# Patient Record
Sex: Female | Born: 1960 | Race: Asian | Hispanic: No | Marital: Married | Smoking: Never smoker
Health system: Southern US, Community
[De-identification: ages and names within clinical notes are randomized; demographics above are authoritative.]

## PROBLEM LIST (undated history)

## (undated) DIAGNOSIS — I10 Essential (primary) hypertension: Secondary | ICD-10-CM

## (undated) DIAGNOSIS — R519 Headache, unspecified: Secondary | ICD-10-CM

## (undated) DIAGNOSIS — R011 Cardiac murmur, unspecified: Secondary | ICD-10-CM

## (undated) DIAGNOSIS — E785 Hyperlipidemia, unspecified: Secondary | ICD-10-CM

## (undated) DIAGNOSIS — R51 Headache: Secondary | ICD-10-CM

## (undated) DIAGNOSIS — K219 Gastro-esophageal reflux disease without esophagitis: Secondary | ICD-10-CM

## (undated) DIAGNOSIS — G43109 Migraine with aura, not intractable, without status migrainosus: Secondary | ICD-10-CM

## (undated) DIAGNOSIS — E119 Type 2 diabetes mellitus without complications: Secondary | ICD-10-CM

## (undated) DIAGNOSIS — G43909 Migraine, unspecified, not intractable, without status migrainosus: Secondary | ICD-10-CM

## (undated) HISTORY — DX: Migraine with aura, not intractable, without status migrainosus: G43.109

## (undated) HISTORY — DX: Essential (primary) hypertension: I10

## (undated) HISTORY — DX: Headache, unspecified: R51.9

## (undated) HISTORY — PX: BREAST BIOPSY: SHX20

## (undated) HISTORY — DX: Gastro-esophageal reflux disease without esophagitis: K21.9

## (undated) HISTORY — DX: Migraine, unspecified, not intractable, without status migrainosus: G43.909

## (undated) HISTORY — DX: Headache: R51

## (undated) HISTORY — DX: Type 2 diabetes mellitus without complications: E11.9

## (undated) HISTORY — DX: Cardiac murmur, unspecified: R01.1

## (undated) HISTORY — DX: Hyperlipidemia, unspecified: E78.5

---

## 2008-05-28 DIAGNOSIS — Z5189 Encounter for other specified aftercare: Secondary | ICD-10-CM

## 2008-05-28 HISTORY — PX: ABDOMINAL HYSTERECTOMY: SHX81

## 2008-05-28 HISTORY — DX: Encounter for other specified aftercare: Z51.89

## 2016-02-21 ENCOUNTER — Encounter (HOSPITAL_COMMUNITY): Payer: Self-pay | Admitting: Family Medicine

## 2016-02-21 ENCOUNTER — Ambulatory Visit (HOSPITAL_COMMUNITY)
Admission: EM | Admit: 2016-02-21 | Discharge: 2016-02-21 | Disposition: A | Discharge status: Home / Self Care | Payer: Self-pay | Attending: Family Medicine | Admitting: Family Medicine

## 2016-02-21 DIAGNOSIS — M546 Pain in thoracic spine: Secondary | ICD-10-CM

## 2016-02-21 DIAGNOSIS — T148 Other injury of unspecified body region: Secondary | ICD-10-CM

## 2016-02-21 DIAGNOSIS — T148XXA Other injury of unspecified body region, initial encounter: Secondary | ICD-10-CM

## 2016-02-21 DIAGNOSIS — R0789 Other chest pain: Secondary | ICD-10-CM

## 2016-02-21 MED ORDER — DICLOFENAC SODIUM 1 % TD GEL: 1.0000 "application " | Freq: Four times a day (QID) | TRANSDERMAL | 0 refills | Status: DC

## 2016-02-21 NOTE — Discharge Instructions (Signed)
Applying the diclofenac gel 4 times a day to the area pain in her back. Apply heating pad off and on during the day. He also use a firm a care heating wrap to place on the area for heat, they can last up to 8 hours at a time. Perform stretches as demonstrated.
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
## 2016-02-21 NOTE — ED Triage Notes (Signed)
Pt here for mid upper back pain that is radiating into epigastric area since yesterday. sts that it is hard for her to take a deep breath. sts some dizziness at times. Pt hypertensive and denies taking BP meds.

## 2016-03-05 ENCOUNTER — Ambulatory Visit (INDEPENDENT_AMBULATORY_CARE_PROVIDER_SITE_OTHER): Payer: Managed Care, Other (non HMO) | Admitting: Primary Care

## 2016-03-05 ENCOUNTER — Encounter: Payer: Self-pay | Admitting: Primary Care

## 2016-03-05 VITALS — BP 158/110 | HR 73 | Temp 98.1°F | Ht 62.75 in | Wt 174.4 lb

## 2016-03-05 DIAGNOSIS — R51 Headache: Secondary | ICD-10-CM | POA: Diagnosis not present

## 2016-03-05 DIAGNOSIS — I10 Essential (primary) hypertension: Secondary | ICD-10-CM

## 2016-03-05 DIAGNOSIS — E66811 Obesity, class 1: Secondary | ICD-10-CM

## 2016-03-05 DIAGNOSIS — E669 Obesity, unspecified: Secondary | ICD-10-CM | POA: Diagnosis not present

## 2016-03-05 DIAGNOSIS — R519 Headache, unspecified: Secondary | ICD-10-CM

## 2016-03-05 MED ORDER — AMLODIPINE BESYLATE 10 MG PO TABS: 10.0000 mg | ORAL_TABLET | Freq: Every day | ORAL | 1 refills | Status: DC

## 2016-03-05 NOTE — Progress Notes (Signed)
Pre visit review using our clinic review tool, if applicable. No additional management support is needed unless otherwise documented below in the visit note. 

## 2016-03-05 NOTE — Assessment & Plan Note (Signed)
Ongoing for years. 8-10 migraines annually. Hypertension likely contributing and will treat. Continue tylenol/excedrin migraine for now, hopefully we will see a reduction in headaches/migraines once BP under control.
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
## 2016-03-05 NOTE — Assessment & Plan Note (Signed)
Steady weight gain over the past 3 years. Poor diet and does not exercise. Encouraged both and information provided on improvements.

## 2016-03-05 NOTE — Assessment & Plan Note (Signed)
No prior diagnosis. Evidence of numerous elevated readings over the past several weeks. BP above goal in the clinic today. Rx for Amlodipine 10 mg sent to pharmacy. She will check BP and bring readings to next visit in 2-3 weeks. BMP next visit.

## 2016-03-05 NOTE — Patient Instructions (Signed)
Start Amlodipine 10 mg tablets for high blood pressure. Take 1 tablet by mouth once daily.  Check your blood pressure daily, around the same time of day, for the next 2 weeks.  Ensure that you have rested for 30 minutes prior to checking your blood pressure. Record your readings and bring them to your next visit.  Work to decrease consumption of fast food, fried food, salty food. Take a look at the information below.  Follow up in 2-3 weeks for re-evaluation of your blood pressure and for complete physical.  It was a pleasure to meet you today! Please don't hesitate to call me with any questions. Welcome to [facility]!  DASH Eating Plan DASH stands for "Dietary Approaches to Stop Hypertension." The DASH eating plan is a healthy eating plan that has been shown to reduce high blood pressure (hypertension). Additional health benefits may include reducing the risk of type 2 diabetes mellitus, heart disease, and stroke. The DASH eating plan may also help with weight loss. WHAT DO I NEED TO KNOW ABOUT THE DASH EATING PLAN? For the DASH eating plan, you will follow these general guidelines:  Choose foods with a percent daily value for sodium of less than 5% (as listed on the food label).  Use salt-free seasonings or herbs instead of table salt or sea salt.  Check with your health care provider or pharmacist before using salt substitutes.  Eat lower-sodium products, often labeled as "lower sodium" or "no salt added."  Eat fresh foods.  Eat more vegetables, fruits, and low-fat dairy products.  Choose whole grains. Look for the word "whole" as the first word in the ingredient list.  Choose fish and skinless chicken or [place] more often than red meat. Limit fish, poultry, and meat to 6 oz (170 g) each day.  Limit sweets, desserts, sugars, and sugary drinks.  Choose heart-healthy fats.  Limit cheese to 1 oz (28 g) per day.  Eat more home-cooked food and less restaurant, buffet, and fast  food.  Limit fried foods.  Cook foods using methods other than frying.  Limit canned vegetables. If you do use them, rinse them well to decrease the sodium.  When eating at a restaurant, ask that your food be prepared with less salt, or no salt if possible. WHAT FOODS CAN I EAT? Seek help from a dietitian for individual calorie needs. Grains Whole grain or whole wheat bread. Brown rice. Whole grain or whole wheat pasta. Quinoa, bulgur, and whole grain cereals. Low-sodium cereals. Corn or whole wheat flour tortillas. Whole grain cornbread. Whole grain crackers. Low-sodium crackers. Vegetables Fresh or frozen vegetables (raw, steamed, roasted, or grilled). Low-sodium or reduced-sodium tomato and vegetable juices. Low-sodium or reduced-sodium tomato sauce and paste. Low-sodium or reduced-sodium canned vegetables.  Fruits All fresh, canned (in natural juice), or frozen fruits. Meat and Other Protein Products Ground beef (85% or leaner), grass-fed beef, or beef trimmed of fat. Skinless chicken or [place]. Ground chicken or [place]. Pork trimmed of fat. All fish and seafood. Eggs. Dried beans, peas, or lentils. Unsalted nuts and seeds. Unsalted canned beans. Dairy Low-fat dairy products, such as skim or 1% milk, 2% or reduced-fat cheeses, low-fat ricotta or cottage cheese, or plain low-fat yogurt. Low-sodium or reduced-sodium cheeses. Fats and Oils Tub margarines without trans fats. Light or reduced-fat mayonnaise and salad dressings (reduced sodium). Avocado. Safflower, olive, or canola oils. Natural peanut or almond butter. Other Unsalted popcorn and pretzels. The items listed above may not be a complete list of recommended  foods or beverages. Contact your dietitian for more options. WHAT FOODS ARE NOT RECOMMENDED? Grains White bread. White pasta. White rice. Refined cornbread. Bagels and croissants. Crackers that contain trans fat. Vegetables Creamed or fried vegetables. Vegetables in a  cheese sauce. Regular canned vegetables. Regular canned tomato sauce and paste. Regular tomato and vegetable juices. Fruits Dried fruits. Canned fruit in light or heavy syrup. Fruit juice. Meat and Other Protein Products Fatty cuts of meat. Ribs, chicken wings, bacon, sausage, bologna, salami, chitterlings, fatback, hot dogs, bratwurst, and packaged luncheon meats. Salted nuts and seeds. Canned beans with salt. Dairy Whole or 2% milk, cream, half-and-half, and cream cheese. Whole-fat or sweetened yogurt. Full-fat cheeses or blue cheese. Nondairy creamers and whipped toppings. Processed cheese, cheese spreads, or cheese curds. Condiments Onion and garlic salt, seasoned salt, table salt, and sea salt. Canned and packaged gravies. Worcestershire sauce. Tartar sauce. Barbecue sauce. Teriyaki sauce. Soy sauce, including reduced sodium. Steak sauce. Fish sauce. Oyster sauce. Cocktail sauce. Horseradish. Ketchup and mustard. Meat flavorings and tenderizers. Bouillon cubes. Hot sauce. Tabasco sauce. Marinades. Taco seasonings. Relishes. Fats and Oils Butter, stick margarine, lard, shortening, ghee, and bacon fat. Coconut, palm kernel, or palm oils. Regular salad dressings. Other Pickles and olives. Salted popcorn and pretzels. The items listed above may not be a complete list of foods and beverages to avoid. Contact your dietitian for more information. WHERE CAN I FIND MORE INFORMATION? National Heart, Lung, and Blood Institute: travelstabloid.com   This information is not intended to replace advice given to you by your health care provider. Make sure you discuss any questions you have with your health care provider.   Document Released: 05/03/2011 Document Revised: 06/04/2014 Document Reviewed: 03/18/2013 Elsevier Interactive Patient Education [employer].

## 2016-03-15 ENCOUNTER — Other Ambulatory Visit: Payer: Self-pay | Admitting: Primary Care

## 2016-03-15 DIAGNOSIS — I10 Essential (primary) hypertension: Secondary | ICD-10-CM

## 2016-03-15 DIAGNOSIS — Z0000 Encounter for general adult medical examination without abnormal findings: Secondary | ICD-10-CM

## 2016-03-23 ENCOUNTER — Other Ambulatory Visit (INDEPENDENT_AMBULATORY_CARE_PROVIDER_SITE_OTHER): Payer: Managed Care, Other (non HMO)

## 2016-03-23 DIAGNOSIS — I10 Essential (primary) hypertension: Secondary | ICD-10-CM

## 2016-03-23 DIAGNOSIS — Z0000 Encounter for general adult medical examination without abnormal findings: Secondary | ICD-10-CM

## 2016-03-23 LAB — COMPREHENSIVE METABOLIC PANEL
ALBUMIN: 4.5 g/dL (ref 3.5–5.2)
ALK PHOS: 59 U/L (ref 39–117)
ALT: 30 U/L (ref 0–35)
AST: 22 U/L (ref 0–37)
BILIRUBIN TOTAL: 0.4 mg/dL (ref 0.2–1.2)
BUN: 16 mg/dL (ref 6–23)
CALCIUM: 9.9 mg/dL (ref 8.4–10.5)
CHLORIDE: 101 meq/L (ref 96–112)
CO2: 31 mEq/L (ref 19–32)
CREATININE: 0.74 mg/dL (ref 0.40–1.20)
GFR: 85.94 mL/min (ref >60.00)
Glucose, Bld: 92 mg/dL (ref 70–99)
Potassium: 3.9 mEq/L (ref 3.5–5.1)
SODIUM: 139 meq/L (ref 135–145)
TOTAL PROTEIN: 8.5 g/dL — AB (ref 6.0–8.3)

## 2016-03-23 LAB — LIPID PANEL
CHOLESTEROL: 246 mg/dL — AB (ref 0–200)
HDL: 55.5 mg/dL (ref >39.00)
LDL Cholesterol: 156 mg/dL — ABNORMAL HIGH (ref 0–99)
NONHDL: 190.2
Total CHOL/HDL Ratio: 4
Triglycerides: 169 mg/dL — ABNORMAL HIGH (ref 0.0–149.0)
VLDL: 33.8 mg/dL (ref 0.0–40.0)

## 2016-03-23 LAB — HEMOGLOBIN A1C: HEMOGLOBIN A1C: 5.9 % (ref 4.6–6.5)

## 2016-03-26 LAB — VITAMIN D 25 HYDROXY (VIT D DEFICIENCY, FRACTURES): VITD: 23.01 ng/mL — AB (ref 30.00–100.00)

## 2016-03-29 ENCOUNTER — Encounter: Payer: Managed Care, Other (non HMO) | Admitting: Primary Care

## 2016-03-30 ENCOUNTER — Ambulatory Visit (INDEPENDENT_AMBULATORY_CARE_PROVIDER_SITE_OTHER): Payer: Managed Care, Other (non HMO) | Admitting: Primary Care

## 2016-03-30 ENCOUNTER — Encounter: Payer: Self-pay | Admitting: Primary Care

## 2016-03-30 VITALS — BP 160/102 | HR 67 | Temp 98.0°F | Ht 63.0 in | Wt 175.8 lb

## 2016-03-30 DIAGNOSIS — Z23 Encounter for immunization: Secondary | ICD-10-CM | POA: Diagnosis not present

## 2016-03-30 DIAGNOSIS — E785 Hyperlipidemia, unspecified: Secondary | ICD-10-CM | POA: Insufficient documentation

## 2016-03-30 DIAGNOSIS — Z0001 Encounter for general adult medical examination with abnormal findings: Secondary | ICD-10-CM

## 2016-03-30 DIAGNOSIS — I10 Essential (primary) hypertension: Secondary | ICD-10-CM

## 2016-03-30 DIAGNOSIS — Z1211 Encounter for screening for malignant neoplasm of colon: Secondary | ICD-10-CM

## 2016-03-30 DIAGNOSIS — R51 Headache: Secondary | ICD-10-CM

## 2016-03-30 DIAGNOSIS — E782 Mixed hyperlipidemia: Secondary | ICD-10-CM

## 2016-03-30 DIAGNOSIS — R7303 Prediabetes: Secondary | ICD-10-CM

## 2016-03-30 DIAGNOSIS — Z1231 Encounter for screening mammogram for malignant neoplasm of breast: Secondary | ICD-10-CM

## 2016-03-30 DIAGNOSIS — Z1239 Encounter for other screening for malignant neoplasm of breast: Secondary | ICD-10-CM

## 2016-03-30 DIAGNOSIS — E119 Type 2 diabetes mellitus without complications: Secondary | ICD-10-CM | POA: Insufficient documentation

## 2016-03-30 DIAGNOSIS — Z0000 Encounter for general adult medical examination without abnormal findings: Secondary | ICD-10-CM

## 2016-03-30 MED ORDER — LISINOPRIL 20 MG PO TABS: 20.0000 mg | ORAL_TABLET | Freq: Every day | ORAL | 1 refills | Status: DC

## 2016-03-30 NOTE — Progress Notes (Signed)
Subjective:    Patient ID: Isabel Caprice, female    DOB: May 15, 1961, 55 y.o.   MRN: [id number]  HPI  Ms. Kio is a 55 year old female who presents today for complete physical.  Immunizations: -Tetanus: Unsure, believes it's been over 10 years. Due. -Influenza: Declines   Diet: She endorses a fair diet. Breakfast: Herbal life shake Lunch: Sandwich Dinner: Salad, meat, pasta, take out food Snacks: Chips Desserts: Occasionally Beverages: Water, coffee, occasional soda  Exercise: She does not currently exercise Eye exam: Completed 2 years ago.  Dental exam: Has not been in 5 years. Colonoscopy: Never completed, due. Pap Smear: Hysterectomy  Mammogram: Been over 5 years, due.  1) Essential Hypertension: Presented as a new patient on 03/05/16 with numerous elevated readings of high blood pressure without a prior diagnosis. She was treated with Amlodipine 10 mg and told to follow up today. She's not been taking the Amlodipine regularly as she has noticed increased headaches and feels "off" after she takes the Amlodipine. She has been checking her BP when on the Amlodipine sparingly which is ranging 150-160/90's.   Review of Systems  Constitutional: Negative for unexpected weight change.  HENT: Negative for rhinorrhea.   Eyes: Positive for visual disturbance.  Respiratory: Negative for cough and shortness of breath.   Cardiovascular: Negative for chest pain.  Gastrointestinal: Negative for constipation and diarrhea.  Genitourinary: Negative for difficulty urinating.  Musculoskeletal: Negative for arthralgias and myalgias.  Skin: Negative for rash.  Allergic/Immunologic: Positive for environmental allergies.  Neurological: Positive for numbness and headaches.  Psychiatric/Behavioral:       Denies concerns for anxiety or depression       Past Medical History:  Diagnosis Date  . Frequent headaches   . Heart murmur   . Hyperlipidemia   . Hypertension   . Migraine        Social History   Social History  . Marital status: Married    Spouse name: N/A  . Number of children: N/A  . Years of education: N/A   Occupational History  . Not on file.   Social History Main Topics  . Smoking status: Never Smoker  . Smokeless tobacco: Never Used  . Alcohol use Not on file  . Drug use: Unknown  . Sexual activity: Not on file   Other Topics Concern  . Not on file   Social History Narrative   Married.   6 children. 1 grandchild.   Works at [employer].   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    Past Surgical History:  Procedure Laterality Date  . ABDOMINAL HYSTERECTOMY  2010    Family History  Problem Relation Age of Onset  . Hyperlipidemia Mother   . Heart disease Mother   . Hypertension Mother   . Heart disease Maternal Grandmother   . Hyperlipidemia Maternal Grandmother   . Hypertension Maternal Grandmother     Allergies  Allergen Reactions  . Penicillins     No current outpatient prescriptions on file prior to visit.   No current facility-administered medications on file prior to visit.     BP (!) 160/102   Pulse 67   Temp 98 F (36.7 C) (Oral)   Ht 5\' 3"  (1.6 m)   Wt 175 lb 12.8 oz (79.7 kg)   SpO2 98%   BMI 31.14 kg/m    Objective:   Physical Exam  Constitutional: She is oriented to person, place, and time. She appears well-nourished.  HENT:  Right Ear: Tympanic membrane  and ear canal normal.  Left Ear: Tympanic membrane and ear canal normal.  Nose: Nose normal.  Mouth/Throat: Oropharynx is clear and moist.  Eyes: Conjunctivae and EOM are normal. Pupils are equal, round, and reactive to light.  Neck: Neck supple. No thyromegaly present.  Cardiovascular: Normal rate and regular rhythm.   No murmur heard. Pulmonary/Chest: Effort normal and breath sounds normal. She has no rales.  Abdominal: Soft. Bowel sounds are normal. There is no tenderness.  Musculoskeletal: Normal range of motion.  Lymphadenopathy:    She has no  cervical adenopathy.  Neurological: She is alert and oriented to person, place, and time. She has normal reflexes. No cranial nerve deficit.  Skin: Skin is warm and dry. No rash noted.  Psychiatric: She has a normal mood and affect.          Assessment & Plan:

## 2016-03-30 NOTE — Assessment & Plan Note (Signed)
TC, LDL and Trigs above goal. Long discussion today regarding importance of improvement in diet and regular exercise. Repeat lipids in 4 months, if above goal, then will consider treatment.

## 2016-03-30 NOTE — Assessment & Plan Note (Signed)
Not taking Amlodipine regularly due to "feeling off". Stop Amlodipine, start Lisinopril 20 mg. BMP stable. Long discussion today regarding importance of BP control. Will call for readings in 2 weeks. Return precautions provided.

## 2016-03-30 NOTE — Assessment & Plan Note (Signed)
Tetanus due, provided today. Declines influenza. Mammogram and colonoscopy due, ordered today. Discussed the importance of a healthy diet and regular exercise in order for weight loss, and to reduce the risk of other medical diseases. Exam unremarkable. Labs with hyperlipidemia and prediabetes which were addressed. Follow up in 1 year for annual physical.

## 2016-03-30 NOTE — Progress Notes (Signed)
Pre visit review using our clinic review tool, if applicable. No additional management support is needed unless otherwise documented below in the visit note. 

## 2016-03-30 NOTE — Assessment & Plan Note (Signed)
A1C of 5.9. Overall poor diet and does not exercise. Long discussion today regarding importance of healthy diet and regular exercise to reduce levels. Repeat A1C in 4 months.

## 2016-03-30 NOTE — Patient Instructions (Addendum)
Stop taking Amlodipine 10 mg tablets for high blood pressure.   Start Lisinopril 20 mg tablets for high blood pressure. Take 1 tablet by mouth once daily.  Continue to monitor your blood pressure. I will call you in 2 weeks for your blood pressure readings.  Your vitamin D level is too low. Start taking Vitamin D 1000 units once daily. This may be purchased over the counter.  You are prediabetic. This means you're are at risk for developing diabetes.  Your cholesterol is too high.  It is important that you improve your diet. Please limit carbohydrates in the form of white bread, rice, pasta, sweets, fast food, fried food, sugary drinks, etc. Increase your consumption of fresh fruits and vegetables, whole grains, lean protein.  Ensure you are consuming 64 ounces of water daily.  Start exercising. You should be getting 150 minutes of moderate intensity exercise weekly.  Please schedule a lab only appointment in 4 months to recheck your cholesterol and blood sugars. Ensure you come fasting at least 4 hours.  It was a pleasure to see you today!   High Cholesterol High cholesterol refers to having a high level of cholesterol in your blood. Cholesterol is a white, waxy, fat-like protein that your body needs in small amounts. Your liver makes all the cholesterol you need. Excess cholesterol comes from the food you eat. Cholesterol travels in your bloodstream through your blood vessels. If you have high cholesterol, deposits (plaque) may build up on the walls of your blood vessels. This makes the arteries narrower and stiffer. Plaque increases your risk of heart attack and stroke. Work with your health care provider to keep your cholesterol levels in a healthy range. RISK FACTORS Several things can make you more likely to have high cholesterol. These include:   Eating foods high in animal fat (saturated fat) or cholesterol.  Being overweight.  Not getting enough exercise.  Having a family  history of high cholesterol. SIGNS AND SYMPTOMS High cholesterol does not cause symptoms. DIAGNOSIS  Your health care provider can do a blood test to check whether you have high cholesterol. If you are older than 20, your health care provider may check your cholesterol every 4-6 years. You may be checked more often if you already have high cholesterol or other risk factors for heart disease. The blood test for cholesterol measures the following:  Bad cholesterol (LDL cholesterol). This is the type of cholesterol that causes heart disease. This number should be less than 100.  Good cholesterol (HDL cholesterol). This type helps protect against heart disease. A healthy level of HDL cholesterol is 60 or higher.  Total cholesterol. This is the combined number of LDL cholesterol and HDL cholesterol. A healthy number is less than 200. TREATMENT  High cholesterol can be treated with diet changes, lifestyle changes, and medicine.   Diet changes may include eating more whole grains, fruits, vegetables, nuts, and fish. You may also have to cut back on red meat and foods with a lot of added sugar.  Lifestyle changes may include getting at least 40 minutes of aerobic exercise three times a week. Aerobic exercises include walking, biking, and swimming. Aerobic exercise along with a healthy diet can help you maintain a healthy weight. Lifestyle changes may also include quitting smoking.  If diet and lifestyle changes are not enough to lower your cholesterol, your health care provider may prescribe a statin medicine. This medicine has been shown to lower cholesterol and also lower the risk of heart  disease. HOME CARE INSTRUCTIONS  Only take over-the-counter or prescription medicines as directed by your health care provider.   Follow a healthy diet as directed by your health care provider. For instance:   Eat chicken (without skin), fish, veal, shellfish, ground [place] breast, and round or loin cuts of  red meat.  Do not eat fried foods and fatty meats, such as hot dogs and salami.   Eat plenty of fruits, such as apples.   Eat plenty of vegetables, such as broccoli, potatoes, and carrots.   Eat beans, peas, and lentils.   Eat grains, such as barley, rice, couscous, and bulgur wheat.   Eat pasta without cream sauces.   Use skim or nonfat milk and low-fat or nonfat yogurt and cheeses. Do not eat or drink whole milk, cream, ice cream, egg yolks, and hard cheeses.   Do not eat stick margarine or tub margarines that contain trans fats (also called partially hydrogenated oils).   Do not eat cakes, cookies, crackers, or other baked goods that contain trans fats.   Do not eat saturated tropical oils, such as coconut and palm oil.   Exercise as directed by your health care provider. Increase your activity level with activities such as gardening or walking.   Keep all follow-up appointments.  SEEK MEDICAL CARE IF:  You are struggling to maintain a healthy diet or weight.  You need help starting an exercise program.  You need help to stop smoking. SEEK IMMEDIATE MEDICAL CARE IF:  You have chest pain.  You have trouble breathing.   This information is not intended to replace advice given to you by your health care provider. Make sure you discuss any questions you have with your health care provider.   Document Released: 05/14/2005 Document Revised: 06/04/2014 Document Reviewed: 03/06/2013 Elsevier Interactive Patient Education [employer].

## 2016-04-02 ENCOUNTER — Encounter: Payer: Self-pay | Admitting: Internal Medicine

## 2016-04-13 ENCOUNTER — Telehealth: Payer: Self-pay | Admitting: Primary Care

## 2016-04-13 NOTE — Telephone Encounter (Signed)
-----   Message from Pleas Koch, NP sent at 03/30/2016  3:53 PM EDT ----- Regarding: BP Please check on patient's blood pressure.

## 2016-04-13 NOTE — Telephone Encounter (Signed)
Message left for patient to return my call.  

## 2016-04-17 NOTE — Telephone Encounter (Signed)
Message left for patient to return my call.  

## 2016-05-08 ENCOUNTER — Other Ambulatory Visit: Payer: Self-pay | Admitting: Primary Care

## 2016-05-08 DIAGNOSIS — I10 Essential (primary) hypertension: Secondary | ICD-10-CM

## 2016-06-06 ENCOUNTER — Encounter: Payer: Managed Care, Other (non HMO) | Admitting: Internal Medicine

## 2016-07-16 ENCOUNTER — Encounter: Payer: Managed Care, Other (non HMO) | Admitting: Internal Medicine

## 2016-07-21 ENCOUNTER — Other Ambulatory Visit: Payer: Self-pay | Admitting: Primary Care

## 2016-07-21 DIAGNOSIS — E559 Vitamin D deficiency, unspecified: Secondary | ICD-10-CM

## 2016-07-21 DIAGNOSIS — R7303 Prediabetes: Secondary | ICD-10-CM

## 2016-07-21 DIAGNOSIS — E785 Hyperlipidemia, unspecified: Secondary | ICD-10-CM

## 2016-08-03 ENCOUNTER — Other Ambulatory Visit: Payer: Managed Care, Other (non HMO)

## 2017-08-07 ENCOUNTER — Other Ambulatory Visit: Payer: Self-pay

## 2017-08-07 ENCOUNTER — Ambulatory Visit: Payer: Self-pay | Admitting: *Deleted

## 2017-08-07 ENCOUNTER — Encounter (HOSPITAL_COMMUNITY): Payer: Self-pay | Admitting: *Deleted

## 2017-08-07 ENCOUNTER — Ambulatory Visit: Payer: Managed Care, Other (non HMO) | Admitting: Family Medicine

## 2017-08-07 ENCOUNTER — Emergency Department (HOSPITAL_COMMUNITY): Payer: Managed Care, Other (non HMO)

## 2017-08-07 ENCOUNTER — Emergency Department (HOSPITAL_COMMUNITY)
Admission: EM | Admit: 2017-08-07 | Discharge: 2017-08-07 | Disposition: A | Discharge status: Home / Self Care | Payer: Managed Care, Other (non HMO) | Attending: Emergency Medicine | Admitting: Emergency Medicine

## 2017-08-07 ENCOUNTER — Telehealth: Payer: Self-pay

## 2017-08-07 DIAGNOSIS — Z79899 Other long term (current) drug therapy: Secondary | ICD-10-CM | POA: Insufficient documentation

## 2017-08-07 DIAGNOSIS — I10 Essential (primary) hypertension: Secondary | ICD-10-CM | POA: Insufficient documentation

## 2017-08-07 DIAGNOSIS — R202 Paresthesia of skin: Secondary | ICD-10-CM | POA: Insufficient documentation

## 2017-08-07 DIAGNOSIS — R200 Anesthesia of skin: Secondary | ICD-10-CM | POA: Diagnosis present

## 2017-08-07 LAB — CBC
HEMATOCRIT: 42.5 % (ref 36.0–46.0)
HEMOGLOBIN: 14.5 g/dL (ref 12.0–15.0)
MCH: 29.7 pg (ref 26.0–34.0)
MCHC: 34.1 g/dL (ref 30.0–36.0)
MCV: 87.1 fL (ref 78.0–100.0)
Platelets: 252 10*3/uL (ref 150–400)
RBC: 4.88 MIL/uL (ref 3.87–5.11)
RDW: 13.2 % (ref 11.5–15.5)
WBC: 6 10*3/uL (ref 4.0–10.5)

## 2017-08-07 LAB — I-STAT TROPONIN, ED: Troponin i, poc: 0 ng/mL (ref 0.00–0.08)

## 2017-08-07 LAB — DIFFERENTIAL
Basophils Absolute: 0 10*3/uL (ref 0.0–0.1)
Basophils Relative: 0 %
Eosinophils Absolute: 0.1 10*3/uL (ref 0.0–0.7)
Eosinophils Relative: 2 %
Lymphocytes Relative: 44 %
Lymphs Abs: 2.6 10*3/uL (ref 0.7–4.0)
MONO ABS: 0.3 10*3/uL (ref 0.1–1.0)
MONOS PCT: 6 %
NEUTROS ABS: 2.9 10*3/uL (ref 1.7–7.7)
Neutrophils Relative %: 48 %

## 2017-08-07 LAB — APTT: aPTT: 28 seconds (ref 24–36)

## 2017-08-07 LAB — COMPREHENSIVE METABOLIC PANEL
ALK PHOS: 62 U/L (ref 38–126)
ALT: 35 U/L (ref 14–54)
AST: 26 U/L (ref 15–41)
Albumin: 3.8 g/dL (ref 3.5–5.0)
Anion gap: 11 (ref 5–15)
BUN: 12 mg/dL (ref 6–20)
CO2: 24 mmol/L (ref 22–32)
CREATININE: 0.79 mg/dL (ref 0.44–1.00)
Calcium: 9 mg/dL (ref 8.9–10.3)
Chloride: 104 mmol/L (ref 101–111)
GFR calc non Af Amer: >60 mL/min (ref >60)
Glucose, Bld: 115 mg/dL — ABNORMAL HIGH (ref 65–99)
Potassium: 3.7 mmol/L (ref 3.5–5.1)
Sodium: 139 mmol/L (ref 135–145)
Total Bilirubin: 0.6 mg/dL (ref 0.3–1.2)
Total Protein: 8 g/dL (ref 6.5–8.1)

## 2017-08-07 LAB — PROTIME-INR
INR: 0.96
Prothrombin Time: 12.7 seconds (ref 11.4–15.2)

## 2017-08-07 MED ORDER — LISINOPRIL 20 MG PO TABS
20.0000 mg | ORAL_TABLET | Freq: Once | ORAL | Status: AC
Start: 2017-08-07 — End: 2017-08-07
  Administered 2017-08-07: 20 mg via ORAL
  Filled 2017-08-07: qty 1

## 2017-08-07 MED ORDER — KETOROLAC TROMETHAMINE 30 MG/ML IJ SOLN
30.0000 mg | Freq: Once | INTRAMUSCULAR | Status: AC
  Administered 2017-08-07: 30 mg via INTRAVENOUS
  Filled 2017-08-07: qty 1

## 2017-08-07 MED ORDER — METHOCARBAMOL 500 MG PO TABS: 500.0000 mg | ORAL_TABLET | Freq: Two times a day (BID) | ORAL | 0 refills | Status: DC

## 2017-08-07 MED ORDER — LIDOCAINE 5 % EX PTCH: 1.0000 | MEDICATED_PATCH | CUTANEOUS | 0 refills | Status: DC

## 2017-08-07 NOTE — ED Triage Notes (Addendum)
Pt states on Fri she woke up nauseated and noticed that her L arm was numb and the tips of fingers of L hand were tingling and she was experiencing L leg pain.  Symptoms have improved, but Her L arm still feels numb and her legs feel heavy.  Pt also states intermittent blurred vision, though she is not experiencing this presently.  [place].

## 2017-08-07 NOTE — Telephone Encounter (Signed)
Called in c/o her left arm being numb with tingling in her fingers since Friday morning.  Her left arm still feels heavy but not as bad as on Friday.  See triage notes below.  I have referred her to the ED due to she is having dizziness (though not right now) and the numbness/heaviness in her left arm.     She is at work now.   She is going to go pick up her husband who is 20 minutes away and then go to the ED.   "I feel more comfortable having him with me" when I suggested he meet her there.   I instructed her to pull over and call 911 immediately if she began having dizziness or her vision became blurry.   She verbalized understanding and agreed to this plan. They are going to [facility]. I routed a note to [facility] so Alma Friendly would be aware of the ED referral.  Reason for Disposition . [1] Weakness (i.e., paralysis, loss of muscle strength) of the face, arm / hand, or leg / foot on one side of the body AND [2] sudden onset AND [3] brief (now gone)  Answer Assessment - Initial Assessment Questions 1. SYMPTOM: "What is the main symptom you are concerned about?" (e.g., weakness, numbness)     Friday woke up nauseated.  I went to work.   I felt my left arm get numb.  It stayed that way most of the day.   Fingers are tingling too.  No headache just nauseas.   It stills feels numb but also heavy but not like on Friday.  Yesterday 173/113 and 1 hour later it was 165/110 these readings are from home.    I'm at work now. 2. ONSET: "When did this start?" (minutes, hours, days; while sleeping)     Last Friday morning when I woke up 3. LAST NORMAL: "When was the last time you were normal (no symptoms)?"     Thursday.    When my BP is up I can't move my left leg  and I feel pain in my left leg.   I feel a pulling from my stomach is reason I can't move the leg.   4. PATTERN "Does this come and go, or has it been constant since it started?"  "Is it present now?"     This morning my legs are  acting up. 5. CARDIAC SYMPTOMS: "Have you had any of the following symptoms: chest pain, difficulty breathing, palpitations?"     No dizziness, but before I would get dizzy.  I think it was my BP.  I would feel like I was floating.   I hear a noise but I can't tell where it's coming from.   I feel lightheaded and can't figure out things around me.   All these things are not happening to me now.   Just the arm tingling is new to me.   6. NEUROLOGIC SYMPTOMS: "Have you had any of the following symptoms: headache, dizziness, vision loss, double vision, changes in speech, unsteady on your feet?"     I'm forgetting a lot of stuff.   No headaches lately I used to all the time.  I have blurry vision all the time but it fluctuates all the time.   7. OTHER SYMPTOMS: "Do you have any other symptoms?"     Recently it's like something sour on my eyes and makes my eyes tear up.  My eye doctor would say have  my cholesterol checked.   Every time I go to the doctor I get a prescription for glasses I can't use because my vision changes.    I think it's my BP but probably not but it's what I'm thinking 8. PREGNANCY: "Is there any chance you are pregnant?" "When was your last menstrual period?"     Not asked  Protocols used: NEUROLOGIC DEFICIT-A-AH

## 2017-08-07 NOTE — Telephone Encounter (Signed)
Pt called;since 08/02/17 pt having numbness in lt arm with tingling in fingers; had episodes before. Legs heavy and difficult to move at times; slight rt sided CP; cannot catch her breath;H/A and dizziness, blurred vision; wrist cuff used to take BP on 08/06/17;BP 173/113 and later BP 163/110. Pt out of lisinopril for 1 month; last refill # 30 x 4 05/08/16; pt said she takes at different times when needed; does not take all the time but out completely for 1 month. Spoke with Gentry Fitz NP who advised needs to be seen urgently today at [facility] or ED; concern of stroke symptoms; also reschedule appt on 08/09/17 to 30' at 10:15. When I went back to phone pt had hung up; made several attempts to call pt contact # and her spouses contact #; got [employer] to transfer me to pt at work but got v/m; left v/m at work and cell for pt to call me back urgently. Have not cancelled 08/09/17 2 pm appt until speaks with pt.FYI to Allie Bossier NP and Jeani Hawking at front desk.

## 2017-08-07 NOTE — Telephone Encounter (Signed)
Stacey Murray was able to change appt on 08/09/17 at 2pm to 75' so 10:15 was cancelled.

## 2017-08-07 NOTE — Telephone Encounter (Signed)
Noted and agree with ED evaluation.  

## 2017-08-07 NOTE — ED Provider Notes (Signed)
[facility] [place] EMERGENCY DEPARTMENT Provider Note   CSN: [id number] Arrival date & time: 08/07/17  1230     History   Chief Complaint Chief Complaint  Patient presents with  . Numbness    L arm    HPI Stacey Murray is a 57 y.o. female.  HPI   Stacey Murray is a 57 y.o. female, with a history of HTN, migraine, hyperlipidemia, and heart murmur, presenting to the ED with left shoulder discomfort along with tingling in the left arm beginning upon waking the morning of Friday, March 8.  Pain seems to be coming from the left superior shoulder, 6/10, described as a tightness, nonradiating.  She endorses numbness to the left arm and tingling to the left hand that has since improved.  Accompanied by nausea and bilateral leg heaviness.  Symptoms do not change with movement of the head.  She does mention bilateral blurred vision, but states this is not new for her. She states she has not taken her lisinopril for her blood pressure for at least a month.  She has run out and has not been back to her PCP to get this medication refilled.  She has an appointment with her PCP on March 15.   Denies fever/chills, neck pain, recent illness, chest pain, shortness of breath, cough, weakness, headache, dizziness, acute vision abnormality, or any other complaints.   Past Medical History:  Diagnosis Date  . Frequent headaches   . Heart murmur   . Hyperlipidemia   . Hypertension   . Migraine     Patient Active Problem List   Diagnosis Date Noted  . Prediabetes 03/30/2016  . Hyperlipidemia 03/30/2016  . Preventative health care 03/30/2016  . Essential hypertension 03/05/2016  . Frequent headaches 03/05/2016  . Obesity (BMI 30.0-34.9) 03/05/2016    Past Surgical History:  Procedure Laterality Date  . ABDOMINAL HYSTERECTOMY  2010    OB History    No data available       Home Medications    Prior to Admission medications   Medication Sig Start Date End Date Taking?  Authorizing Provider  lisinopril (PRINIVIL,ZESTRIL) 20 MG tablet TAKE 1 TABLET BY MOUTH EVERY DAY 05/08/16  Yes Pleas Koch, NP  lidocaine (LIDODERM) 5 % Place 1 patch onto the skin daily. Remove & Discard patch within 12 hours or as directed by MD 08/07/17   Ysabelle Goodroe C, PA-C  methocarbamol (ROBAXIN) 500 MG tablet Take 1 tablet (500 mg total) by mouth 2 (two) times daily. 08/07/17   Elsey Holts, Helane Gunther, PA-C    Family History Family History  Problem Relation Age of Onset  . Hyperlipidemia Mother   . Heart disease Mother   . Hypertension Mother   . Heart disease Maternal Grandmother   . Hyperlipidemia Maternal Grandmother   . Hypertension Maternal Grandmother     Social History Social History   Tobacco Use  . Smoking status: Never Smoker  . Smokeless tobacco: Never Used  Substance Use Topics  . Alcohol use: No    Frequency: Never  . Drug use: No     Allergies   Penicillins   Review of Systems Review of Systems  Constitutional: Negative for chills, diaphoresis, fatigue and fever.  Respiratory: Negative for cough and shortness of breath.   Cardiovascular: Negative for chest pain.  Gastrointestinal: Negative for abdominal pain, diarrhea, nausea and vomiting.  Musculoskeletal: Positive for arthralgias. Negative for neck pain.  Neurological: Positive for numbness. Negative for dizziness, syncope, weakness, light-headedness and  headaches.  All other systems reviewed and are negative.    Physical Exam Updated Vital Signs BP (!) 181/83 (BP Location: Right Arm)   Pulse 83   Temp 97.9 F (36.6 C) (Oral)   Resp 18   Ht 5\' 3"  (1.6 m)   Wt 81.6 kg (180 lb)   SpO2 100%   BMI 31.89 kg/m   Physical Exam  Constitutional: She is oriented to person, place, and time. She appears well-developed and well-nourished. No distress.  HENT:  Head: Normocephalic and atraumatic.  Mouth/Throat: Oropharynx is clear and moist.  Eyes: Conjunctivae and EOM are normal. Pupils are equal,  round, and reactive to light.  Neck: Normal range of motion. Neck supple.  Cardiovascular: Normal rate, regular rhythm, normal heart sounds and intact distal pulses.  Pulmonary/Chest: Effort normal and breath sounds normal. No respiratory distress.  Abdominal: Soft. There is no tenderness. There is no guarding.  Musculoskeletal: She exhibits tenderness. She exhibits no edema.  Tenderness to left superior shoulder.  Patient endorses increased "tightness" with left shoulder abduction. Range of motion intact in the shoulders, elbows, and wrists.  Lymphadenopathy:    She has no cervical adenopathy.  Neurological: She is alert and oriented to person, place, and time.  No sensory deficits.  No noted speech deficits. No aphasia. Patient handles oral secretions without difficulty. No noted swallowing defects.  Equal grip strength bilaterally. Strength 5/5 in the upper extremities. Strength 5/5 with flexion and extension of the hips, knees, and ankles bilaterally.  Negative Romberg. No gait disturbance.  Coordination intact including heel to shin and finger to nose.  Cranial nerves III-XII grossly intact.  No facial droop.   Skin: Skin is warm and dry. Capillary refill takes less than 2 seconds. She is not diaphoretic.  Psychiatric: She has a normal mood and affect. Her behavior is normal.  Nursing note and vitals reviewed.    ED Treatments / Results  Labs (all labs ordered are listed, but only abnormal results are displayed) Labs Reviewed  COMPREHENSIVE METABOLIC PANEL - Abnormal; Notable for the following components:      Result Value   Glucose, Bld 115 (*)    All other components within normal limits  PROTIME-INR  APTT  CBC  DIFFERENTIAL  I-STAT TROPONIN, ED    EKG  EKG Interpretation  Date/Time:  Wednesday August 07 2017 13:11:26 EDT Ventricular Rate:  75 PR Interval:  148 QRS Duration: 80 QT Interval:  412 QTC Calculation: 460 R Axis:     Text Interpretation:  Normal  sinus rhythm Normal ECG No STEMI.  Confirmed by Nanda Quinton [phone]) on 08/07/2017 11:01:44 PM       Radiology Dg Chest 2 View  Result Date: 08/07/2017 CLINICAL DATA:  Right-sided chest pain with dyspnea EXAM: CHEST - 2 VIEW COMPARISON:  None. FINDINGS: The cardiopericardial silhouette is top-normal in size. Minimal aortic atherosclerosis at the arch without aneurysm. Emphysematous hyperinflation of the lungs without acute pneumonic consolidation, effusion or pneumothorax. Minimal subpleural atelectasis or scarring is seen in the left mid lung. IMPRESSION: Pulmonary hyperinflation compatible with COPD. Minimal aortic atherosclerosis. Electronically Signed   By: Ashley Royalty M.D.   On: 08/07/2017 18:24   Ct Head Wo Contrast  Result Date: 08/07/2017 CLINICAL DATA:  Headache since 08/02/2017 with left arm numbness and heaviness in both legs. EXAM: CT HEAD WITHOUT CONTRAST TECHNIQUE: Contiguous axial images were obtained from the base of the skull through the vertex without intravenous contrast. COMPARISON:  None. FINDINGS: Brain: No evidence  of acute infarction, hemorrhage, hydrocephalus, extra-axial collection or mass lesion/mass effect. Vascular: No hyperdense vessel or unexpected calcification. Skull: Intact. Sinuses/Orbits: Paranasal sinuses are clear. Small right mastoid effusion is noted. Other: None. IMPRESSION: Small right mastoid effusion. The examination is otherwise negative. Electronically Signed   By: Inge Rise M.D.   On: 08/07/2017 14:52   Mr Brain Wo Contrast  Result Date: 08/07/2017 CLINICAL DATA:  Initial evaluation for acute onset left upper extremity numbness, nausea. EXAM: MRI HEAD WITHOUT CONTRAST MRI CERVICAL SPINE WITHOUT CONTRAST TECHNIQUE: Multiplanar, multiecho pulse sequences of the brain and surrounding structures, and cervical spine, to include the craniocervical junction and cervicothoracic junction, were obtained without intravenous contrast. COMPARISON:  Prior CT  from earlier the same day. FINDINGS: MRI HEAD FINDINGS Brain: Cerebral volume within normal limits. No significant cerebral white matter changes for age. No abnormal foci of restricted diffusion to suggest acute or subacute ischemia. Gray-white matter differentiation well maintained. No encephalomalacia to suggest chronic infarction. No acute or chronic intracranial hemorrhage. No mass lesion, midline shift or mass effect. No hydrocephalus. No extra-axial fluid collection. Major dural sinuses are patent. Pituitary gland suprasellar region normal. Midline structures intact and normal. Vascular: Major intracranial vascular flow voids are maintained. Skull and upper cervical spine: Craniocervical junction normal. Bone marrow signal intensity within normal limits. No scalp soft tissue abnormality. Plagiocephaly noted. Sinuses/Orbits: Globes normal soft tissues within normal limits. Paranasal sinuses are clear. Small bilateral mastoid effusions noted, of doubtful significance. Inner ear structures normal. Other: None. MRI CERVICAL SPINE FINDINGS Alignment: 3 mm retrolisthesis of C5 on C6. Vertebral bodies otherwise normally aligned with preservation of the normal cervical lordosis. Vertebrae: Vertebral body heights maintained without evidence for acute or chronic fracture. Bone marrow signal intensity within normal limits. No discrete or worrisome osseous lesions. Veins STIR signal abnormality within the C7 and T1 vertebral bodies favored to be artifactual. Cord: Signal intensity within the cervical spinal cord is normal. Posterior Fossa, vertebral arteries, paraspinal tissues: Craniocervical junction normal. Paraspinous and prevertebral soft tissues within normal limits. Normal intravascular flow voids present within the vertebral arteries bilaterally. Disc levels: C2-C3: Unremarkable. C3-C4: Mild bilateral uncovertebral hypertrophy without significant disc bulge. No canal or foraminal stenosis. C4-C5: Mild diffuse disc  bulge with bilateral uncovertebral hypertrophy. No significant canal or foraminal stenosis. C5-C6: Trace retrolisthesis. Diffuse disc bulge with bilateral uncovertebral hypertrophy. Mild bilateral facet hypertrophy. Flattening of the ventral thecal sac with resultant mild spinal stenosis. Moderate right with mild left C6 foraminal narrowing. C6-C7:  Mild facet hypertrophy.  No canal or foraminal stenosis. C7-T1:  Unremarkable. Visualized upper thoracic spine demonstrates no significant finding. IMPRESSION: 1. Normal MRI of the brain. No acute intracranial process identified. 2. No acute abnormality within the cervical spine. 3. 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 4. No other significant degenerative changes within the cervical spine. No other canal or foraminal stenosis. Electronically Signed   By: Jeannine Boga M.D.   On: 08/07/2017 22:01   Mr Cervical Spine Wo Contrast  Result Date: 08/07/2017 CLINICAL DATA:  Initial evaluation for acute onset left upper extremity numbness, nausea. EXAM: MRI HEAD WITHOUT CONTRAST MRI CERVICAL SPINE WITHOUT CONTRAST TECHNIQUE: Multiplanar, multiecho pulse sequences of the brain and surrounding structures, and cervical spine, to include the craniocervical junction and cervicothoracic junction, were obtained without intravenous contrast. COMPARISON:  Prior CT from earlier the same day. FINDINGS: MRI HEAD FINDINGS Brain: Cerebral volume within normal limits. No significant  cerebral white matter changes for age. No abnormal foci of restricted diffusion to suggest acute or subacute ischemia. Gray-white matter differentiation well maintained. No encephalomalacia to suggest chronic infarction. No acute or chronic intracranial hemorrhage. No mass lesion, midline shift or mass effect. No hydrocephalus. No extra-axial fluid collection. Major dural sinuses are patent. Pituitary gland suprasellar  region normal. Midline structures intact and normal. Vascular: Major intracranial vascular flow voids are maintained. Skull and upper cervical spine: Craniocervical junction normal. Bone marrow signal intensity within normal limits. No scalp soft tissue abnormality. Plagiocephaly noted. Sinuses/Orbits: Globes normal soft tissues within normal limits. Paranasal sinuses are clear. Small bilateral mastoid effusions noted, of doubtful significance. Inner ear structures normal. Other: None. MRI CERVICAL SPINE FINDINGS Alignment: 3 mm retrolisthesis of C5 on C6. Vertebral bodies otherwise normally aligned with preservation of the normal cervical lordosis. Vertebrae: Vertebral body heights maintained without evidence for acute or chronic fracture. Bone marrow signal intensity within normal limits. No discrete or worrisome osseous lesions. Veins STIR signal abnormality within the C7 and T1 vertebral bodies favored to be artifactual. Cord: Signal intensity within the cervical spinal cord is normal. Posterior Fossa, vertebral arteries, paraspinal tissues: Craniocervical junction normal. Paraspinous and prevertebral soft tissues within normal limits. Normal intravascular flow voids present within the vertebral arteries bilaterally. Disc levels: C2-C3: Unremarkable. C3-C4: Mild bilateral uncovertebral hypertrophy without significant disc bulge. No canal or foraminal stenosis. C4-C5: Mild diffuse disc bulge with bilateral uncovertebral hypertrophy. No significant canal or foraminal stenosis. C5-C6: Trace retrolisthesis. Diffuse disc bulge with bilateral uncovertebral hypertrophy. Mild bilateral facet hypertrophy. Flattening of the ventral thecal sac with resultant mild spinal stenosis. Moderate right with mild left C6 foraminal narrowing. C6-C7:  Mild facet hypertrophy.  No canal or foraminal stenosis. C7-T1:  Unremarkable. Visualized upper thoracic spine demonstrates no significant finding. IMPRESSION: 1. Normal MRI of the  brain. No acute intracranial process identified. 2. No acute abnormality within the cervical spine. 3. 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 4. No other significant degenerative changes within the cervical spine. No other canal or foraminal stenosis. Electronically Signed   By: Jeannine Boga M.D.   On: 08/07/2017 22:01   Dg Shoulder Left  Result Date: 08/07/2017 CLINICAL DATA:  57 year old female with left arm numbness and tingling for 1 week. EXAM: LEFT SHOULDER - 2+ VIEW COMPARISON:  Chest radiograph dated 08/07/2017 FINDINGS: There is no evidence of fracture or dislocation. There is no evidence of arthropathy or other focal bone abnormality. Soft tissues are unremarkable. IMPRESSION: Negative. Electronically Signed   By: Anner Crete M.D.   On: 08/07/2017 18:20    Procedures Procedures (including critical care time)  Medications Ordered in ED Medications  lisinopril (PRINIVIL,ZESTRIL) tablet 20 mg (20 mg Oral Given 08/07/17 1654)  ketorolac (TORADOL) 30 MG/ML injection 30 mg (30 mg Intravenous Given 08/07/17 1832)     Initial Impression / Assessment and Plan / ED Course  I have reviewed the triage vital signs and the nursing notes.  Pertinent labs & imaging results that were available during my care of the patient were reviewed by me and considered in my medical decision making (see chart for details).     Patient presents with left shoulder pain and tingling in the left arm.  Appears to be neurovascularly intact on exam.  No acute abnormalities on imaging.  PCP and neurology follow-up. The patient was given instructions for home care as well as return precautions. Patient  voices understanding of these instructions, accepts the plan, and is comfortable with discharge.    Findings and plan of care discussed with Gara Kroner, MD. Dr. Laverta Baltimore personally evaluated and examined this patient.  Vitals:     08/07/17 1622 08/07/17 1623 08/07/17 1645 08/07/17 1715  BP:  (!) 191/94 (!) 178/89 (!) 161/93  Pulse:  78 64 60  Resp:  18 17 20   Temp: 98.7 F (37.1 C) 98.7 F (37.1 C)    TempSrc:  Oral    SpO2:  100% 98% 98%  Weight:      Height:       Vitals:   08/07/17 1800 08/07/17 1830 08/07/17 1930 08/07/17 2000  BP: (!) 149/84 132/82 136/82 131/81  Pulse: 67 61 68 (!) 57  Resp: 15 20 16 16   Temp:      TempSrc:      SpO2: 98% 98% 97% 96%  Weight:      Height:          Final Clinical Impressions(s) / ED Diagnoses   Final diagnoses:  Paresthesias    ED Discharge Orders        Ordered    methocarbamol (ROBAXIN) 500 MG tablet  2 times daily     08/07/17 2211    lidocaine (LIDODERM) 5 %  Every 24 hours     08/07/17 2211       Lorayne Bender, PA-C 08/08/17 1603    Margette Fast, MD 08/09/17 1007

## 2017-08-07 NOTE — ED Notes (Signed)
Patient transported to MRI 

## 2017-08-07 NOTE — Telephone Encounter (Signed)
Please see nurse triage note 08/07/17; pt going to ED.

## 2017-08-07 NOTE — Discharge Instructions (Signed)
There were no acute abnormalities on the imaging studies.   Take it easy, but do not lay around too much as this may make any stiffness worse.  Antiinflammatory medications: Take 600 mg of ibuprofen every 6 hours or 440 mg (over the counter dose) to 500 mg (prescription dose) of naproxen every 12 hours for the next 3 days. After this time, these medications may be used as needed for pain. Take these medications with food to avoid upset stomach. Choose only one of these medications, do not take them together.  Tylenol: Should you continue to have additional pain while taking the ibuprofen or naproxen, you may add in tylenol as needed. Your daily total maximum amount of tylenol from all sources should be limited to 4000mg /day for persons without liver problems, or 2000mg /day for those with liver problems. Muscle relaxer: Robaxin is a muscle relaxer and may help loosen stiff muscles. Do not take the Robaxin while driving or performing other dangerous activities.  Lidocaine patches: These are available via either prescription or over-the-counter. The over-the-counter option may be more economical one and are likely just as effective. There are multiple over-the-counter brands, such as Salonpas. Exercises: Be sure to perform the attached exercises starting with three times a week and working up to performing them daily. This is an essential part of preventing long term problems.   Follow up with a primary care provider and the neurologist for any future management of these complaints.

## 2017-08-07 NOTE — Telephone Encounter (Signed)
Please see phone note on 08/07/17.

## 2017-08-07 NOTE — ED Provider Notes (Signed)
Patient placed in Quick Look pathway, seen and evaluated   Chief Complaint: Left upper extremity numbness.  HPI:   Patient presents complaining of acute onset of left upper extremity numbness which she noted on Friday with awakening.  She notes associated nausea.  Her numbness has improved.  She also notes intermittent feeling of "heaviness "in her left lower extremity.  She states that she experiences blurry vision intermittently at work but this has been an ongoing problem and not new to her.  No headaches presently.  She takes lisinopril "as needed "and her blood pressure today is 191/101.  She is a non-smoker.  ROS: Positive for numbness, vision changes negative for headaches, syncope, chest pain, or shortness of breath.  Physical Exam:   Gen: No distress  Neuro: Awake and Alert  Skin: Warm    Focused Exam: Heart regular rate and rhythm, no murmurs, rubs, or gallops noted.  Equal rise and fall of chest, breath sounds clear to auscultation bilaterally.  She notes decreased sensation on soft touch of the left upper extremity circumferentially but otherwise sensation intact to soft touch of extremities.  No pronator drift.  Cranial nerves II through XII tested and intact.  5/5 strength of BUE and BLE major muscle groups.   Initiation of care has begun. The patient has been counseled on the process, plan, and necessity for staying for the completion/evaluation, and the remainder of the medical screening examination    Debroah Baller 08/07/17 1337    Isla Pence, MD 08/07/17 [phone]

## 2017-08-09 ENCOUNTER — Encounter: Payer: Self-pay | Admitting: Primary Care

## 2017-08-09 ENCOUNTER — Ambulatory Visit: Payer: Managed Care, Other (non HMO) | Admitting: Primary Care

## 2017-08-09 VITALS — BP 104/80 | HR 83 | Temp 98.1°F | Ht 63.0 in | Wt 180.4 lb

## 2017-08-09 DIAGNOSIS — R29898 Other symptoms and signs involving the musculoskeletal system: Secondary | ICD-10-CM | POA: Diagnosis not present

## 2017-08-09 DIAGNOSIS — E782 Mixed hyperlipidemia: Secondary | ICD-10-CM

## 2017-08-09 DIAGNOSIS — I10 Essential (primary) hypertension: Secondary | ICD-10-CM

## 2017-08-09 DIAGNOSIS — R7303 Prediabetes: Secondary | ICD-10-CM

## 2017-08-09 LAB — LIPID PANEL
CHOLESTEROL: 219 mg/dL — AB (ref 0–200)
HDL: 45.5 mg/dL (ref >39.00)
LDL CALC: 137 mg/dL — AB (ref 0–99)
NonHDL: 173.86
Total CHOL/HDL Ratio: 5
Triglycerides: 185 mg/dL — ABNORMAL HIGH (ref 0.0–149.0)
VLDL: 37 mg/dL (ref 0.0–40.0)

## 2017-08-09 LAB — HEMOGLOBIN A1C: HEMOGLOBIN A1C: 6.3 % (ref 4.6–6.5)

## 2017-08-09 NOTE — Patient Instructions (Addendum)
Stop by the lab prior to leaving today. I will notify you of your results once received.   Start monitoring your blood pressure daily, around the same time of day, for the next 2 weeks.  Ensure that you have rested for 30 minutes prior to checking your blood pressure. Record your readings and send them to me via My Chart.  You will be contacted regarding your referral to Neurology.  Please let us know if you have not been contacted within one week.   It was a pleasure to see you today!

## 2017-08-09 NOTE — Assessment & Plan Note (Addendum)
BP normal, actually on the lower side of normal without BP meds for 1 month. Will have her start monitoring home readings once daily and send them via my chart.

## 2017-08-09 NOTE — Assessment & Plan Note (Signed)
Repeat lipids pending.  

## 2017-08-09 NOTE — Assessment & Plan Note (Addendum)
More so "heaviness" but also with periods of weakness. Neuro exam and MSK exam today unremarkable. Reviewed imaging from recent hospital visit.  Referral placed to neurology for further evaluation of lower extremity heaviness with inability to walk. Since this is bilateral and without chronic back pain, there is some suspicion for MS vs other neuro disorder.   All hospital labs, imaging, notes reviewed.
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
## 2017-08-09 NOTE — Assessment & Plan Note (Signed)
Repeat A1C pending. 

## 2017-09-01 ENCOUNTER — Encounter: Payer: Self-pay | Admitting: Primary Care

## 2017-09-01 DIAGNOSIS — I10 Essential (primary) hypertension: Secondary | ICD-10-CM

## 2017-09-03 MED ORDER — LISINOPRIL 10 MG PO TABS: ORAL_TABLET | ORAL | 0 refills | Status: DC

## 2017-09-05 ENCOUNTER — Encounter: Payer: Self-pay | Admitting: Neurology

## 2017-09-05 ENCOUNTER — Ambulatory Visit: Payer: Managed Care, Other (non HMO) | Admitting: Neurology

## 2017-09-05 VITALS — BP 158/96 | HR 92 | Ht 63.0 in | Wt 181.0 lb

## 2017-09-05 DIAGNOSIS — G43109 Migraine with aura, not intractable, without status migrainosus: Secondary | ICD-10-CM | POA: Diagnosis not present

## 2017-09-05 DIAGNOSIS — R202 Paresthesia of skin: Secondary | ICD-10-CM | POA: Insufficient documentation

## 2017-09-05 HISTORY — DX: Migraine with aura, not intractable, without status migrainosus: G43.109

## 2017-09-05 MED ORDER — RIZATRIPTAN BENZOATE 10 MG PO TABS: 10.0000 mg | ORAL_TABLET | Freq: Three times a day (TID) | ORAL | 3 refills | Status: DC | PRN

## 2017-09-05 NOTE — Patient Instructions (Signed)
   We will get EMG and NCV study to look at the nerve function of the arms and legs.  Use Maxalt if needed for the headache.
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
## 2017-10-08 ENCOUNTER — Encounter: Payer: Self-pay | Admitting: Neurology

## 2017-10-08 ENCOUNTER — Ambulatory Visit (INDEPENDENT_AMBULATORY_CARE_PROVIDER_SITE_OTHER): Payer: Managed Care, Other (non HMO) | Admitting: Neurology

## 2017-10-08 ENCOUNTER — Ambulatory Visit: Payer: Managed Care, Other (non HMO) | Admitting: Neurology

## 2017-10-08 DIAGNOSIS — R202 Paresthesia of skin: Secondary | ICD-10-CM

## 2017-10-08 NOTE — Procedures (Signed)
     HISTORY:  Stacey Murray is a 57 year old patient with a history of some neck and shoulder discomfort, paresthesias down the left arm.  She also reports some low back pain with left leg discomfort.  She is currently being treated through a chiropractor.  She returns for an evaluation.  NERVE CONDUCTION STUDIES:  Nerve conduction studies were performed on the left upper extremity. The distal motor latencies and motor amplitudes for the median and ulnar nerves were within normal limits. The nerve conduction velocities for these nerves were also normal. The sensory latencies for the median and ulnar nerves were normal.  The F-wave latency for the left ulnar nerve was normal.  Nerve conduction studies were performed on both lower extremities. The distal motor latencies and motor amplitudes for the peroneal and posterior tibial nerves were within normal limits. The nerve conduction velocities for these nerves were also normal. The sensory latencies for the peroneal and sural nerves were within normal limits.  The F-wave latencies for the posterior tibial nerves were within normal limits bilaterally.   EMG STUDIES:  EMG study was performed on the left upper extremity:  The first dorsal interosseous muscle reveals 2 to 4 K units with full recruitment. No fibrillations or positive waves were noted. The abductor pollicis brevis muscle reveals 2 to 4 K units with full recruitment. No fibrillations or positive waves were noted. The extensor indicis proprius muscle reveals 1 to 3 K units with full recruitment. No fibrillations or positive waves were noted. The pronator teres muscle reveals 2 to 3 K units with full recruitment. No fibrillations or positive waves were noted. The biceps muscle reveals 1 to 2 K units with full recruitment. No fibrillations or positive waves were noted. The triceps muscle reveals 2 to 4 K units with full recruitment. No fibrillations or positive waves were noted. The  anterior deltoid muscle reveals 2 to 3 K units with full recruitment. No fibrillations or positive waves were noted. The cervical paraspinal muscles were tested at 2 levels. No abnormalities of insertional activity were seen at either level tested. There was poor relaxation.  EMG study was performed on the left lower extremity:  The tibialis anterior muscle reveals 2 to 4K motor units with full recruitment. No fibrillations or positive waves were seen. The peroneus tertius muscle reveals 2 to 4K motor units with full recruitment. No fibrillations or positive waves were seen. The medial gastrocnemius muscle reveals 1 to 3K motor units with full recruitment. No fibrillations or positive waves were seen. The vastus lateralis muscle reveals 2 to 4K motor units with full recruitment. No fibrillations or positive waves were seen. The iliopsoas muscle reveals 2 to 4K motor units with full recruitment. No fibrillations or positive waves were seen. The biceps femoris muscle (long head) reveals 2 to 4K motor units with full recruitment. No fibrillations or positive waves were seen. The lumbosacral paraspinal muscles were tested at 3 levels, and revealed no abnormalities of insertional activity at all 3 levels tested. There was good relaxation.   IMPRESSION:  Nerve conduction studies were performed on the left upper extremity and both lower extremities.  The studies were completely normal, no evidence of a neuropathy is seen.  EMG evaluation of the left upper and left lower extremities were unremarkable without evidence of an overlying cervical or lumbar radiculopathy.  Jill Alexanders MD 10/08/2017 4:35 PM  [facility] [address] [place], [place] [address]  Phone [phone] Fax [phone]

## 2017-10-08 NOTE — Progress Notes (Signed)
Please refer to EMG and nerve conduction study procedure note. 

## 2017-10-08 NOTE — Progress Notes (Addendum)
The patient comes back in today for EMG nerve conduction study evaluation.  These studies were complete and normal.  Patient is getting therapy through a chiropractor, she has had MRI of the brain and cervical spine, if the discomfort in the back and left leg do not improve with therapy, she is to contact our office and we will consider MRI of the lumbar spine in the future.  The patient may desire medications for discomfort such as gabapentin in the future, she will call our office if this is the case.     Quay    Nerve / Sites Muscle Latency Ref. Amplitude Ref. Rel Amp Segments Distance Velocity Ref. Area    ms ms mV mV %  cm m/s m/s mVms  L Median - APB     Wrist APB 3.1 ?4.4 11.0 ?4.0 100 Wrist - APB 7   41.5     Upper arm APB 6.8  10.1  92.1 Upper arm - Wrist 21 57 ?49 36.8  L Ulnar - ADM     Wrist ADM 2.8 ?3.3 9.0 ?6.0 100 Wrist - ADM 7   33.1     B.Elbow ADM 5.7  7.7  85.9 B.Elbow - Wrist 17 57 ?49 29.2     A.Elbow ADM 7.6  7.0  90.8 A.Elbow - B.Elbow 10 53 ?49 27.8         A.Elbow - Wrist      R Peroneal - EDB     Ankle EDB 3.9 ?6.5 6.0 ?2.0 100 Ankle - EDB 9   20.7     Fib head EDB 9.0  5.5  92.2 Fib head - Ankle 27 53 ?44 20.4     Pop fossa EDB 10.7  5.0  90.5 Pop fossa - Fib head 10 56 ?44 20.8         Pop fossa - Ankle      L Peroneal - EDB     Ankle EDB 3.8 ?6.5 7.1 ?2.0 100 Ankle - EDB 9   25.6     Fib head EDB 8.6  6.1  85.6 Fib head - Ankle 27 56 ?44 24.2     Pop fossa EDB 10.5  6.0  98.2 Pop fossa - Fib head 10 52 ?44 23.4         Pop fossa - Ankle      R Tibial - AH     Ankle AH 3.6 ?5.8 13.7 ?4.0 100 Ankle - AH 9   34.5     Pop fossa AH 10.4  12.5  91.5 Pop fossa - Ankle 33 49 ?41 30.8  L Tibial - AH     Ankle AH 3.7 ?5.8 13.3 ?4.0 100 Ankle - AH 9   36.6     Pop fossa AH 10.7  8.5  63.7 Pop fossa - Ankle 33 47 ?41 27.9                 SNC    Nerve / Sites Rec. Site Peak Lat Ref.  Amp Ref. Segments Distance    ms ms V V  cm  R Sural - Ankle (Calf)     Calf  Ankle 3.0 ?4.4 12 ?6 Calf - Ankle 14  L Sural - Ankle (Calf)     Calf Ankle 2.8 ?4.4 12 ?6 Calf - Ankle 14  L Superficial peroneal - Ankle     Lat leg Ankle 3.9 ?4.4 8 ?6 Lat leg - Ankle 14  R Superficial  peroneal - Ankle     Lat leg Ankle 3.7 ?4.4 10 ?6 Lat leg - Ankle 14  L Median - Orthodromic (Dig II, Mid palm)     Dig II Wrist 2.6 ?3.4 40 ?10 Dig II - Wrist 13  L Ulnar - Orthodromic, (Dig V, Mid palm)     Dig V Wrist 2.7 ?3.1 6 ?5 Dig V - Wrist 37                  F  Wave    Nerve F Lat Ref.   ms ms  R Tibial - AH 46.8 ?56.0  L Tibial - AH 47.4 ?56.0  L Ulnar - ADM 25.2 ?32.0

## 2018-03-13 ENCOUNTER — Ambulatory Visit: Payer: Managed Care, Other (non HMO) | Admitting: Nurse Practitioner

## 2019-03-06 ENCOUNTER — Ambulatory Visit (INDEPENDENT_AMBULATORY_CARE_PROVIDER_SITE_OTHER)
Admission: RE | Admit: 2019-03-06 | Discharge: 2019-03-06 | Disposition: A | Discharge status: Home / Self Care | Payer: Managed Care, Other (non HMO) | Source: Ambulatory Visit | Attending: Primary Care | Admitting: Primary Care

## 2019-03-06 ENCOUNTER — Other Ambulatory Visit: Payer: Self-pay

## 2019-03-06 ENCOUNTER — Ambulatory Visit (INDEPENDENT_AMBULATORY_CARE_PROVIDER_SITE_OTHER): Payer: Managed Care, Other (non HMO) | Admitting: Primary Care

## 2019-03-06 VITALS — BP 136/82 | HR 101 | Temp 97.8°F | Ht 63.0 in | Wt 186.8 lb

## 2019-03-06 DIAGNOSIS — Z1231 Encounter for screening mammogram for malignant neoplasm of breast: Secondary | ICD-10-CM

## 2019-03-06 DIAGNOSIS — R519 Headache, unspecified: Secondary | ICD-10-CM

## 2019-03-06 DIAGNOSIS — M25572 Pain in left ankle and joints of left foot: Secondary | ICD-10-CM | POA: Diagnosis not present

## 2019-03-06 DIAGNOSIS — Z1159 Encounter for screening for other viral diseases: Secondary | ICD-10-CM | POA: Diagnosis not present

## 2019-03-06 DIAGNOSIS — Z1211 Encounter for screening for malignant neoplasm of colon: Secondary | ICD-10-CM

## 2019-03-06 DIAGNOSIS — R7303 Prediabetes: Secondary | ICD-10-CM

## 2019-03-06 DIAGNOSIS — I10 Essential (primary) hypertension: Secondary | ICD-10-CM | POA: Diagnosis not present

## 2019-03-06 DIAGNOSIS — Z23 Encounter for immunization: Secondary | ICD-10-CM

## 2019-03-06 DIAGNOSIS — Z0001 Encounter for general adult medical examination with abnormal findings: Secondary | ICD-10-CM

## 2019-03-06 DIAGNOSIS — G43109 Migraine with aura, not intractable, without status migrainosus: Secondary | ICD-10-CM

## 2019-03-06 DIAGNOSIS — E782 Mixed hyperlipidemia: Secondary | ICD-10-CM

## 2019-03-06 NOTE — Assessment & Plan Note (Signed)
Denies headaches and migraines.

## 2019-03-06 NOTE — Assessment & Plan Note (Signed)
Denies concerns for headaches
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
## 2019-03-06 NOTE — Assessment & Plan Note (Signed)
BP borderline in the office today, she is not monitoring her BP at home.   Will have her monitor BP at home and report consistent readings at or above 135/90.

## 2019-03-06 NOTE — Assessment & Plan Note (Signed)
Repeat lipid panel pending.  Discussed the importance of a healthy diet and regular exercise in order for weight loss, and to reduce the risk of any potential medical problems.  

## 2019-03-06 NOTE — Assessment & Plan Note (Signed)
Repeat A1C pending. Encouraged weight loss through diet and exercise.  

## 2019-03-06 NOTE — Assessment & Plan Note (Signed)
Tetanus UTD, Shingrix and influenza vaccination provided. Mammogram over due, ordered. Colonoscopy over due, ordered. Discussed the importance of a healthy diet and regular exercise in order for weight loss, and to reduce the risk of any potential medical problems. Exam today as noted. Labs pending.

## 2019-03-06 NOTE — Patient Instructions (Signed)
Stop by the lab and xray prior to leaving today. I will notify you of your results once received.   Wrap the ankle with an ACE bandage for support. Elevate the ankle when resting to reduce swelling.  Start Aleve twice daily for 5-7 days for swelling and pain. Use ice to help reduce swelling.  Start exercising. You should be getting 150 minutes of moderate intensity exercise weekly.  It's important to improve your diet by reducing consumption of fast food, fried food, processed snack foods, sugary drinks. Increase consumption of fresh vegetables and fruits, whole grains, water.  Ensure you are drinking 64 ounces of water daily.  Call the breast center to schedule your mammogram.  You will be contacted regarding your referral to GI for the colonoscopy.  Please let us know if you have not been contacted within one week.   Schedule a nurse visit for 2-6 months from now for second shingles vaccination.  It was a pleasure to see you today!   Preventive Care 37-50 Years Old, Female Preventive care refers to visits with your health care provider and lifestyle choices that can promote health and wellness. This includes:  A yearly physical exam. This may also be called an annual well check.  Regular dental visits and eye exams.  Immunizations.  Screening for certain conditions.  Healthy lifestyle choices, such as eating a healthy diet, getting regular exercise, not using drugs or products that contain nicotine and tobacco, and limiting alcohol use. What can I expect for my preventive care visit? Physical exam Your health care provider will check your:  Height and weight. This may be used to calculate body mass index (BMI), which tells if you are at a healthy weight.  Heart rate and blood pressure.  Skin for abnormal spots. Counseling Your health care provider may ask you questions about your:  Alcohol, tobacco, and drug use.  Emotional well-being.  Home and relationship  well-being.  Sexual activity.  Eating habits.  Work and work Statistician.  Method of birth control.  Menstrual cycle.  Pregnancy history. What immunizations do I need?  Influenza (flu) vaccine  This is recommended every year. Tetanus, diphtheria, and pertussis (Tdap) vaccine  You may need a Td booster every 10 years. Varicella (chickenpox) vaccine  You may need this if you have not been vaccinated. Zoster (shingles) vaccine  You may need this after age 35. Measles, mumps, and rubella (MMR) vaccine  You may need at least one dose of MMR if you were born in 1957 or later. You may also need a second dose. Pneumococcal conjugate (PCV13) vaccine  You may need this if you have certain conditions and were not previously vaccinated. Pneumococcal polysaccharide (PPSV23) vaccine  You may need one or two doses if you smoke cigarettes or if you have certain conditions. Meningococcal conjugate (MenACWY) vaccine  You may need this if you have certain conditions. Hepatitis A vaccine  You may need this if you have certain conditions or if you travel or work in places where you may be exposed to hepatitis A. Hepatitis B vaccine  You may need this if you have certain conditions or if you travel or work in places where you may be exposed to hepatitis B. Haemophilus influenzae type b (Hib) vaccine  You may need this if you have certain conditions. Human papillomavirus (HPV) vaccine  If recommended by your health care provider, you may need three doses over 6 months. You may receive vaccines as individual doses or as more  than one vaccine together in one shot (combination vaccines). Talk with your health care provider about the risks and benefits of combination vaccines. What tests do I need? Blood tests  Lipid and cholesterol levels. These may be checked every 5 years, or more frequently if you are over 33 years old.  Hepatitis C test.  Hepatitis B test. Screening  Lung  cancer screening. You may have this screening every year starting at age 50 if you have a 30-pack-year history of smoking and currently smoke or have quit within the past 15 years.  Colorectal cancer screening. All adults should have this screening starting at age 61 and continuing until age 55. Your health care provider may recommend screening at age 47 if you are at increased risk. You will have tests every 1-10 years, depending on your results and the type of screening test.  Diabetes screening. This is done by checking your blood sugar (glucose) after you have not eaten for a while (fasting). You may have this done every 1-3 years.  Mammogram. This may be done every 1-2 years. Talk with your health care provider about when you should start having regular mammograms. This may depend on whether you have a family history of breast cancer.  BRCA-related cancer screening. This may be done if you have a family history of breast, ovarian, tubal, or peritoneal cancers.  Pelvic exam and Pap test. This may be done every 3 years starting at age 3. Starting at age 33, this may be done every 5 years if you have a Pap test in combination with an HPV test. Other tests  Sexually transmitted disease (STD) testing.  Bone density scan. This is done to screen for osteoporosis. You may have this scan if you are at high risk for osteoporosis. Follow these instructions at home: Eating and drinking  Eat a diet that includes fresh fruits and vegetables, whole grains, lean protein, and low-fat dairy.  Take vitamin and mineral supplements as recommended by your health care provider.  Do not drink alcohol if: ? Your health care provider tells you not to drink. ? You are pregnant, may be pregnant, or are planning to become pregnant.  If you drink alcohol: ? Limit how much you have to 0-1 drink a day. ? Be aware of how much alcohol is in your drink. In the [place] one drink equals one 12 oz bottle of beer (355  mL), one 5 oz glass of wine (148 mL), or one 1 oz glass of hard liquor (44 mL). Lifestyle  Take daily care of your teeth and gums.  Stay active. Exercise for at least 30 minutes on 5 or more days each week.  Do not use any products that contain nicotine or tobacco, such as cigarettes, e-cigarettes, and chewing tobacco. If you need help quitting, ask your health care provider.  If you are sexually active, practice safe sex. Use a condom or other form of birth control (contraception) in order to prevent pregnancy and STIs (sexually transmitted infections).  If told by your health care provider, take low-dose aspirin daily starting at age 90. What's next?  Visit your health care provider once a year for a well check visit.  Ask your health care provider how often you should have your eyes and teeth checked.  Stay up to date on all vaccines. This information is not intended to replace advice given to you by your health care provider. Make sure you discuss any questions you have with your health care  provider. Document Released: 06/10/2015 Document Revised: 01/23/2018 Document Reviewed: 01/23/2018 Elsevier Patient Education  2020 [employer].

## 2019-03-06 NOTE — Progress Notes (Signed)
Subjective:    Patient ID: Stacey Murray, female    DOB: [birth year], 58 y.o.   MRN: [id number]  HPI  Stacey Murray is a 58 year old female who presents today for complete physical and a chief complaint of ankle swelling.   Located to the left ankle that occurred shortly after hearing a pop to the location after rising from the toilet. She rolled the left ankle outward. This occurred about three weeks ago. She does walk on the ankle with some limping on a flat surface, more difficulty when walking on uneven surfaces. She's been taking Tylenol with some improvement temporarily with pain. 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 She does elevate her leg at times, hot/cool compresses.  Immunizations: -Tetanus: Completed in 2017 -Influenza: Due today -Shingles: Never completed  Diet: She endorses a poor diet. She is eating steak, fewer carbohydrates, some vegetables, daily fruit. No take out food. She is drinking little water, coffee, hot tea. Exercise: She is not exercising  Eye exam: Completed 2 years ago Dental exam: No recent exam Colonoscopy: Never completed Pap Smear: Hysterectomy  Mammogram: No recent exam Hep C Screen: Due   BP Readings from Last 3 Encounters:  03/06/19 136/82  09/05/17 (!) 158/96  08/09/17 104/80      Review of Systems  Constitutional: Negative for unexpected weight change.  HENT: Negative for rhinorrhea.   Respiratory: Negative for cough and shortness of breath.   Cardiovascular: Negative for chest pain.  Gastrointestinal: Negative for constipation and diarrhea.  Genitourinary: Negative for difficulty urinating.  Musculoskeletal: Positive for arthralgias.       Acute left ankle pain  Skin: Negative for rash.  Allergic/Immunologic: Negative for environmental allergies.  Neurological: Negative for dizziness, numbness and headaches.  Psychiatric/Behavioral: The patient is not nervous/anxious.        Past Medical History:  Diagnosis Date   . Classic migraine with aura 09/05/2017  . Frequent headaches   . Heart murmur   . Hyperlipidemia   . Hypertension   . Migraine      Social History   Socioeconomic History  . Marital status: Married    Spouse name: Not on file  . Number of children: Not on file  . Years of education: Not on file  . Highest education level: Not on file  Occupational History  . Occupation: [employer]  Social Needs  . Financial resource strain: Not on file  . Food insecurity    Worry: Not on file    Inability: Not on file  . Transportation needs    Medical: Not on file    Non-medical: Not on file  Tobacco Use  . Smoking status: Never Smoker  . Smokeless tobacco: Never Used  Substance and Sexual Activity  . Alcohol use: Yes    Frequency: Never    Comment: rare  . Drug use: No  . Sexual activity: Not on file  Lifestyle  . Physical activity    Days per week: Not on file    Minutes per session: Not on file  . Stress: Not on file  Relationships  . Social Herbalist on phone: Not on file    Gets together: Not on file    Attends religious service: Not on file    Active member of club or organization: Not on file    Attends meetings of clubs or organizations: Not on file    Relationship status: Not on file  . Intimate  partner violence    Fear of current or ex partner: Not on file    Emotionally abused: Not on file    Physically abused: Not on file    Forced sexual activity: Not on file  Other Topics Concern  . Not on file  Social History Narrative   Lives w/ spouse   Caffeine use: coffee/tea/soda daily   Right handed    Married.   6 children. 1 grandchild.   Works at [employer].   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    Past Surgical History:  Procedure Laterality Date  . ABDOMINAL HYSTERECTOMY  2010    Family History  Problem Relation Age of Onset  . Hyperlipidemia Mother   . Heart disease Mother   . Hypertension Mother   . Heart disease Maternal Grandmother   .  Hyperlipidemia Maternal Grandmother   . Hypertension Maternal Grandmother   . Liver disease Father        ETOH  . Lupus Sister   . Intracerebral hemorrhage Sister     Allergies  Allergen Reactions  . Penicillins Other (See Comments)    unknown    No current outpatient medications on file prior to visit.   No current facility-administered medications on file prior to visit.     BP 136/82   Pulse (!) 101   Temp 97.8 F (36.6 C) (Temporal)   Ht 5\' 3"  (1.6 m)   Wt 186 lb 12 oz (84.7 kg)   SpO2 98%   BMI 33.08 kg/m    Objective:   Physical Exam  Constitutional: She is oriented to person, place, and time. She appears well-nourished.  HENT:  Right Ear: Tympanic membrane and ear canal normal.  Left Ear: Tympanic membrane and ear canal normal.  Mouth/Throat: Oropharynx is clear and moist.  Eyes: Pupils are equal, round, and reactive to light. EOM are normal.  Neck: Neck supple.  Cardiovascular: Normal rate and regular rhythm.  Pulses:      Dorsalis pedis pulses are 2+ on the left side.       Posterior tibial pulses are 2+ on the left side.  Respiratory: Effort normal and breath sounds normal.  GI: Soft. Bowel sounds are normal. There is no abdominal tenderness.  Musculoskeletal:     Comments: Moderate swelling to entire left ankle with the majority of swelling to lateral malleolus. Tender. Decrease in ROM with most plans of movement.   Neurological: She is alert and oriented to person, place, and time.  Skin: Skin is warm and dry.  Psychiatric: She has a normal mood and affect.           Assessment & Plan:

## 2019-03-07 LAB — COMPREHENSIVE METABOLIC PANEL
ALT: 27 IU/L (ref 0–32)
AST: 20 IU/L (ref 0–40)
Albumin/Globulin Ratio: 1.4 (ref 1.2–2.2)
Albumin: 4.7 g/dL (ref 3.8–4.9)
Alkaline Phosphatase: 69 IU/L (ref 39–117)
BUN/Creatinine Ratio: 16 (ref 12–28)
BUN: 12 mg/dL (ref 8–27)
Bilirubin Total: 0.2 mg/dL (ref 0.0–1.2)
CO2: 25 mmol/L (ref 20–29)
Calcium: 10 mg/dL (ref 8.7–10.3)
Chloride: 100 mmol/L (ref 96–106)
Creatinine, Ser: 0.75 mg/dL (ref 0.57–1.00)
GFR calc Af Amer: 100 mL/min/{1.73_m2} (ref >59)
GFR calc non Af Amer: 87 mL/min/{1.73_m2} (ref >59)
Globulin, Total: 3.3 g/dL (ref 1.5–4.5)
Glucose: 89 mg/dL (ref 65–99)
Potassium: 4 mmol/L (ref 3.5–5.2)
Sodium: 140 mmol/L (ref 134–144)
Total Protein: 8 g/dL (ref 6.0–8.5)

## 2019-03-07 LAB — LIPID PANEL
Chol/HDL Ratio: 4.5 ratio — ABNORMAL HIGH (ref 0.0–4.4)
Cholesterol, Total: 241 mg/dL — ABNORMAL HIGH (ref 100–199)
HDL: 54 mg/dL (ref >39)
LDL Chol Calc (NIH): 156 mg/dL — ABNORMAL HIGH (ref 0–99)
Triglycerides: 171 mg/dL — ABNORMAL HIGH (ref 0–149)
VLDL Cholesterol Cal: 31 mg/dL (ref 5–40)

## 2019-03-07 LAB — HEMOGLOBIN A1C
Est. average glucose Bld gHb Est-mCnc: 131 mg/dL
Hgb A1c MFr Bld: 6.2 % — ABNORMAL HIGH (ref 4.8–5.6)

## 2019-03-07 LAB — HEPATITIS C ANTIBODY: Hep C Virus Ab: <0.1 s/co ratio (ref 0.0–0.9)

## 2019-03-09 NOTE — Addendum Note (Signed)
Addended by: Jacqualin Combes on: 03/09/2019 09:35 AM   Modules accepted: Orders

## 2019-06-11 ENCOUNTER — Ambulatory Visit: Payer: Managed Care, Other (non HMO)

## 2019-06-24 ENCOUNTER — Ambulatory Visit (INDEPENDENT_AMBULATORY_CARE_PROVIDER_SITE_OTHER): Payer: Managed Care, Other (non HMO)

## 2019-06-24 ENCOUNTER — Other Ambulatory Visit: Payer: Self-pay

## 2019-06-24 DIAGNOSIS — Z23 Encounter for immunization: Secondary | ICD-10-CM

## 2019-06-24 NOTE — Progress Notes (Signed)
Per orders of Alma Friendly, NP injection of Shingrix #2 given by Kris Mouton. Patient tolerated injection well.

## 2020-01-21 LAB — LIPID PANEL
Cholesterol: 245 — AB (ref 0–200)
HDL: 54 (ref 35–70)
LDL Cholesterol: 155
Triglycerides: 197 — AB (ref 40–160)

## 2020-01-21 LAB — COMPREHENSIVE METABOLIC PANEL
GFR calc Af Amer: 97
GFR calc non Af Amer: 84

## 2020-01-21 LAB — BASIC METABOLIC PANEL: Creatinine: 0.8 (ref 0.5–1.1)

## 2020-01-21 LAB — HEMOGLOBIN A1C: Hemoglobin A1C: 6.3

## 2020-04-27 ENCOUNTER — Encounter: Payer: Self-pay | Admitting: Primary Care

## 2020-04-27 NOTE — Telephone Encounter (Signed)
Patient is overdue for follow up. Will see her as scheduled for 12/06

## 2020-04-27 NOTE — Telephone Encounter (Signed)
Have abstracted in chart no further action needed.

## 2020-04-29 ENCOUNTER — Ambulatory Visit: Payer: Managed Care, Other (non HMO) | Admitting: Primary Care

## 2020-04-29 ENCOUNTER — Other Ambulatory Visit: Payer: Self-pay

## 2020-04-29 ENCOUNTER — Encounter: Payer: Self-pay | Admitting: Primary Care

## 2020-04-29 VITALS — BP 166/114 | HR 88 | Temp 97.2°F | Ht 63.0 in | Wt 186.0 lb

## 2020-04-29 DIAGNOSIS — E782 Mixed hyperlipidemia: Secondary | ICD-10-CM | POA: Diagnosis not present

## 2020-04-29 DIAGNOSIS — R7303 Prediabetes: Secondary | ICD-10-CM | POA: Diagnosis not present

## 2020-04-29 DIAGNOSIS — Z23 Encounter for immunization: Secondary | ICD-10-CM | POA: Diagnosis not present

## 2020-04-29 DIAGNOSIS — I10 Essential (primary) hypertension: Secondary | ICD-10-CM

## 2020-04-29 DIAGNOSIS — K219 Gastro-esophageal reflux disease without esophagitis: Secondary | ICD-10-CM

## 2020-04-29 MED ORDER — HYDROCHLOROTHIAZIDE 12.5 MG PO TABS: 12.5000 mg | ORAL_TABLET | Freq: Every day | ORAL | 0 refills | Status: DC

## 2020-04-29 MED ORDER — FAMOTIDINE 20 MG PO TABS: 20.0000 mg | ORAL_TABLET | Freq: Two times a day (BID) | ORAL | 0 refills | Status: DC

## 2020-04-29 NOTE — Assessment & Plan Note (Addendum)
Symptoms seem representative of GERD, but keep gall bladder etiology in differentials. No abdominal tenderness on exam, negative Murphy's sign.  Rx for famotidine 20 mg BID sent to pharmacy. We will see her in a few weeks for follow up.  Handout for GERD trigger foods provided.

## 2020-04-29 NOTE — Patient Instructions (Signed)
Stop taking lisinopril for blood pressure.  Start taking hydrochlorothiazide 12.5 mg once daily for blood pressure.  Start famotidine 20 mg for heartburn and gas. Take 1 tablet by mouth twice daily.   Please schedule a physical to meet with me in 2 weeks.  It was a pleasure to see you today!   Food Choices for Gastroesophageal Reflux Disease, Adult When you have gastroesophageal reflux disease (GERD), the foods you eat and your eating habits are very important. Choosing the right foods can help ease your discomfort. Think about working with a nutrition specialist (dietitian) to help you make good choices. What are tips for following this plan?  Meals  Choose healthy foods that are low in fat, such as fruits, vegetables, whole grains, low-fat dairy products, and lean meat, fish, and poultry.  Eat small meals often instead of 3 large meals a day. Eat your meals slowly, and in a place where you are relaxed. Avoid bending over or lying down until 2-3 hours after eating.  Avoid eating meals 2-3 hours before bed.  Avoid drinking a lot of liquid with meals.  Cook foods using methods other than frying. Bake, grill, or broil food instead.  Avoid or limit: ? Chocolate. ? Peppermint or spearmint. ? Alcohol. ? Pepper. ? Black and decaffeinated coffee. ? Black and decaffeinated tea. ? Bubbly (carbonated) soft drinks. ? Caffeinated energy drinks and soft drinks.  Limit high-fat foods such as: ? Fatty meat or fried foods. ? Whole milk, cream, butter, or ice cream. ? Nuts and nut butters. ? Pastries, donuts, and sweets made with butter or shortening.  Avoid foods that cause symptoms. These foods may be different for everyone. Common foods that cause symptoms include: ? Tomatoes. ? Oranges, lemons, and limes. ? Peppers. ? Spicy food. ? Onions and garlic. ? Vinegar. Lifestyle  Maintain a healthy weight. Ask your doctor what weight is healthy for you. If you need to lose weight, work  with your doctor to do so safely.  Exercise for at least 30 minutes for 5 or more days each week, or as told by your doctor.  Wear loose-fitting clothes.  Do not smoke. If you need help quitting, ask your doctor.  Sleep with the head of your bed higher than your feet. Use a wedge under the mattress or blocks under the bed frame to raise the head of the bed. Summary  When you have gastroesophageal reflux disease (GERD), food and lifestyle choices are very important in easing your symptoms.  Eat small meals often instead of 3 large meals a day. Eat your meals slowly, and in a place where you are relaxed.  Limit high-fat foods such as fatty meat or fried foods.  Avoid bending over or lying down until 2-3 hours after eating.  Avoid peppermint and spearmint, caffeine, alcohol, and chocolate. This information is not intended to replace advice given to you by your health care provider. Make sure you discuss any questions you have with your health care provider. Document Revised: 09/04/2018 Document Reviewed: 06/19/2016 Elsevier Patient Education  Stacey Murray.

## 2020-04-29 NOTE — Progress Notes (Signed)
Subjective:    Patient ID: Stacey Murray, female    DOB: [birth year], 59 y.o.   MRN: [id number]  HPI  This visit occurred during the SARS-CoV-2 public health emergency.  Safety protocols were in place, including screening questions prior to the visit, additional usage of staff PPE, and extensive cleaning of exam room while observing appropriate contact time as indicated for disinfecting solutions.   Stacey Murray is a 59 year old female with a history of hypertension, migraines, frequent headaches, prediabetes who presents today with a chief complaint of hypertension and abdominal pain.  1) Essential Hypertension: Currently not managed on treatment, once managed on lisinopril 10 and 20 mg but she developed headaches and feels weak when her BP reduced to the 120 range so she stopped.   She resumed her lisinopril 10 mg three weeks ago due to symptoms of headaches and not feeling well. She started checking her BP and noticed readings in the 140's-170's/70's-low 100's. She took 1/2 tablet of her lisinopril daily for two weeks, then 1 full tablet over the last one week. She had her blood pressure measured at work in late August 2021which was 200/100.  Since resuming her lisinopril she's noticed an intermittent dry cough, coughing spells, tickle to the throat. She has gained 10 pounds since working from home over the last 1-2 years.   She has labs with her today from her employer, drawn in August 2021.  2) Abdominal Pain: Located to the bilateral upper abdomen, "feels like gas" and occurs daily and "moves around". She's recently been drinking Coke which helps to reduce her pain. Pain is worse when eating.    She does notice frequent symptoms of esophageal burning, epigastric pressure.  BP Readings from Last 3 Encounters:  04/29/20 (!) 166/114  03/06/19 136/82  09/05/17 (!) 158/96   Wt Readings from Last 3 Encounters:  04/29/20 186 lb (84.4 kg)  03/06/19 186 lb 12 oz (84.7 kg)  09/05/17 181 lb  (82.1 kg)     Review of Systems  Constitutional: Positive for fatigue.  Eyes: Negative for visual disturbance.  Respiratory: Positive for cough.   Gastrointestinal: Positive for abdominal pain. Negative for blood in stool, constipation, diarrhea, nausea and vomiting.       GERD  Neurological: Positive for headaches.       Past Medical History:  Diagnosis Date  . Classic migraine with aura 09/05/2017  . Frequent headaches   . Heart murmur   . Hyperlipidemia   . Hypertension   . Migraine      Social History   Socioeconomic History  . Marital status: Married    Spouse name: Not on file  . Number of children: Not on file  . Years of education: Not on file  . Highest education level: Not on file  Occupational History  . Occupation: [employer]  Tobacco Use  . Smoking status: Never Smoker  . Smokeless tobacco: Never Used  Vaping Use  . Vaping Use: Never used  Substance and Sexual Activity  . Alcohol use: Yes    Comment: rare  . Drug use: No  . Sexual activity: Not on file  Other Topics Concern  . Not on file  Social History Narrative   Lives w/ spouse   Caffeine use: coffee/tea/soda daily   Right handed    Married.   6 children. 1 grandchild.   Works at [employer].   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   Social Determinants of Health   Financial Resource Strain:   .  Difficulty of Paying Living Expenses: Not on file  Food Insecurity:   . Worried About Charity fundraiser in the Last Year: Not on file  . Ran Out of Food in the Last Year: Not on file  Transportation Needs:   . Lack of Transportation (Medical): Not on file  . Lack of Transportation (Non-Medical): Not on file  Physical Activity:   . Days of Exercise per Week: Not on file  . Minutes of Exercise per Session: Not on file  Stress:   . Feeling of Stress : Not on file  Social Connections:   . Frequency of Communication with Friends and Family: Not on file  . Frequency of Social Gatherings with Friends  and Family: Not on file  . Attends Religious Services: Not on file  . Active Member of Clubs or Organizations: Not on file  . Attends Archivist Meetings: Not on file  . Marital Status: Not on file  Intimate Partner Violence:   . Fear of Current or Ex-Partner: Not on file  . Emotionally Abused: Not on file  . Physically Abused: Not on file  . Sexually Abused: Not on file    Past Surgical History:  Procedure Laterality Date  . ABDOMINAL HYSTERECTOMY  2010    Family History  Problem Relation Age of Onset  . Hyperlipidemia Mother   . Heart disease Mother   . Hypertension Mother   . Heart disease Maternal Grandmother   . Hyperlipidemia Maternal Grandmother   . Hypertension Maternal Grandmother   . Liver disease Father        ETOH  . Lupus Sister   . Intracerebral hemorrhage Sister     Allergies  Allergen Reactions  . Penicillins Other (See Comments)    unknown    Current Outpatient Medications on File Prior to Visit  Medication Sig Dispense Refill  . Ascorbic Acid (VITAMIN C PO) Take by mouth.    . Cyanocobalamin (VITAMIN B-12 PO) Take by mouth.    . Multiple Vitamin (MULTIVITAMIN ADULT PO) Take by mouth.    Marland Kitchen VITAMIN D-VITAMIN K PO Take by mouth.     No current facility-administered medications on file prior to visit.    BP (!) 166/114   Pulse 88   Temp (!) 97.2 F (36.2 C) (Temporal)   Ht 5\' 3"  (1.6 m)   Wt 186 lb (84.4 kg)   SpO2 96%   BMI 32.95 kg/m    Objective:   Physical Exam Cardiovascular:     Rate and Rhythm: Normal rate and regular rhythm.  Pulmonary:     Effort: Pulmonary effort is normal.     Breath sounds: Normal breath sounds.  Abdominal:     General: Abdomen is flat. Bowel sounds are normal. There is no distension.     Palpations: Abdomen is soft.     Tenderness: There is no abdominal tenderness.  Musculoskeletal:     Cervical back: Neck supple.  Skin:    General: Skin is warm and dry.  Psychiatric:        Mood and  Affect: Mood normal.            Assessment & Plan:

## 2020-04-29 NOTE — Assessment & Plan Note (Signed)
LDL of 155 from August 2021 labs.  Discussed the importance of a healthy diet and regular exercise in order for weight loss, and to reduce the risk of any potential medical problems.  Repeat lipids at next visit.

## 2020-04-29 NOTE — Assessment & Plan Note (Signed)
Above goal in the office today, also with home readings. 10 pound weight gain over the last year, more sedentary now that she is working from home.  Lisinopril seems to be causing an ACE-I cough. Stop lisinopril.  Start HCTZ 12.5 mg. She is reticent to try a higher dose or dual therapy as it makes her feel worse.  We will plan to see her back in 2 weeks for BP check and labs.

## 2020-04-29 NOTE — Assessment & Plan Note (Signed)
Noted on labs from August 2021, A1C of 6.3.  Discussed the importance of a healthy diet and regular exercise in order for weight loss, and to reduce the risk of any potential medical problems.  Repeat A1C at next visit.

## 2020-05-02 ENCOUNTER — Ambulatory Visit: Payer: Managed Care, Other (non HMO) | Admitting: Primary Care

## 2020-05-02 ENCOUNTER — Other Ambulatory Visit: Payer: Self-pay | Admitting: Primary Care

## 2020-05-16 ENCOUNTER — Encounter: Payer: Self-pay | Admitting: Primary Care

## 2020-05-16 ENCOUNTER — Other Ambulatory Visit: Payer: Self-pay

## 2020-05-16 ENCOUNTER — Ambulatory Visit: Payer: Managed Care, Other (non HMO) | Admitting: Primary Care

## 2020-05-16 VITALS — BP 134/72 | HR 98 | Temp 97.6°F | Ht 63.0 in | Wt 188.0 lb

## 2020-05-16 DIAGNOSIS — Z1211 Encounter for screening for malignant neoplasm of colon: Secondary | ICD-10-CM

## 2020-05-16 DIAGNOSIS — I10 Essential (primary) hypertension: Secondary | ICD-10-CM

## 2020-05-16 DIAGNOSIS — R350 Frequency of micturition: Secondary | ICD-10-CM

## 2020-05-16 DIAGNOSIS — R7303 Prediabetes: Secondary | ICD-10-CM | POA: Diagnosis not present

## 2020-05-16 DIAGNOSIS — K219 Gastro-esophageal reflux disease without esophagitis: Secondary | ICD-10-CM

## 2020-05-16 DIAGNOSIS — Z1231 Encounter for screening mammogram for malignant neoplasm of breast: Secondary | ICD-10-CM

## 2020-05-16 DIAGNOSIS — G43109 Migraine with aura, not intractable, without status migrainosus: Secondary | ICD-10-CM

## 2020-05-16 DIAGNOSIS — R29898 Other symptoms and signs involving the musculoskeletal system: Secondary | ICD-10-CM

## 2020-05-16 DIAGNOSIS — R519 Headache, unspecified: Secondary | ICD-10-CM

## 2020-05-16 DIAGNOSIS — E782 Mixed hyperlipidemia: Secondary | ICD-10-CM

## 2020-05-16 DIAGNOSIS — Z0001 Encounter for general adult medical examination with abnormal findings: Secondary | ICD-10-CM

## 2020-05-16 DIAGNOSIS — R319 Hematuria, unspecified: Secondary | ICD-10-CM

## 2020-05-16 LAB — POC URINALSYSI DIPSTICK (AUTOMATED)
Bilirubin, UA: NEGATIVE
Glucose, UA: NEGATIVE
Ketones, UA: NEGATIVE
Leukocytes, UA: NEGATIVE
Nitrite, UA: NEGATIVE
Protein, UA: POSITIVE — AB
Spec Grav, UA: 1.025 (ref 1.010–1.025)
Urobilinogen, UA: 0.2 E.U./dL
pH, UA: 5 (ref 5.0–8.0)

## 2020-05-16 LAB — CBC
HCT: 45.8 % (ref 36.0–46.0)
Hemoglobin: 15 g/dL (ref 12.0–15.0)
MCHC: 32.7 g/dL (ref 30.0–36.0)
MCV: 87.6 fl (ref 78.0–100.0)
Platelets: 256 10*3/uL (ref 150.0–400.0)
RBC: 5.23 Mil/uL — ABNORMAL HIGH (ref 3.87–5.11)
RDW: 13.5 % (ref 11.5–15.5)
WBC: 8.1 10*3/uL (ref 4.0–10.5)

## 2020-05-16 LAB — COMPREHENSIVE METABOLIC PANEL
ALT: 25 U/L (ref 0–35)
AST: 20 U/L (ref 0–37)
Albumin: 4.3 g/dL (ref 3.5–5.2)
Alkaline Phosphatase: 60 U/L (ref 39–117)
BUN: 20 mg/dL (ref 6–23)
CO2: 30 mEq/L (ref 19–32)
Calcium: 9.9 mg/dL (ref 8.4–10.5)
Chloride: 99 mEq/L (ref 96–112)
Creatinine, Ser: 0.88 mg/dL (ref 0.40–1.20)
GFR: 71 mL/min (ref >60.00)
Glucose, Bld: 123 mg/dL — ABNORMAL HIGH (ref 70–99)
Potassium: 3.5 mEq/L (ref 3.5–5.1)
Sodium: 137 mEq/L (ref 135–145)
Total Bilirubin: 0.3 mg/dL (ref 0.2–1.2)
Total Protein: 8.6 g/dL — ABNORMAL HIGH (ref 6.0–8.3)

## 2020-05-16 LAB — LIPID PANEL
Cholesterol: 236 mg/dL — ABNORMAL HIGH (ref 0–200)
HDL: 54.6 mg/dL (ref >39.00)
LDL Cholesterol: 161 mg/dL — ABNORMAL HIGH (ref 0–99)
NonHDL: 181
Total CHOL/HDL Ratio: 4
Triglycerides: 101 mg/dL (ref 0.0–149.0)
VLDL: 20.2 mg/dL (ref 0.0–40.0)

## 2020-05-16 LAB — HEMOGLOBIN A1C: Hgb A1c MFr Bld: 6.6 % — ABNORMAL HIGH (ref 4.6–6.5)

## 2020-05-16 NOTE — Assessment & Plan Note (Signed)
Improved and stable in the office today. Continue HCTZ 12.5 mg daily for now. CMP pending.  She will work on weight loss with exercise and dietary changes. Continue to monitor.

## 2020-05-16 NOTE — Assessment & Plan Note (Signed)
Overall infrequent, continue to monitor.

## 2020-05-16 NOTE — Assessment & Plan Note (Signed)
Evaluated by neurology in 2019, negative work up at that time. Exam today unremarkable.  She is very sedentary, strongly encouraged her to get up and walk on her treadmill 30 minutes daily. She will update.

## 2020-05-16 NOTE — Assessment & Plan Note (Signed)
Immunizations UTD. Mammogram overdue, ordered and pending. Colonoscopy overdue, referral placed to GI. Discussed the importance of a healthy diet and regular exercise in order for weight loss, and to reduce the risk of any potential medical problems.  Exam today stable.  Labs pending.

## 2020-05-16 NOTE — Assessment & Plan Note (Signed)
Repeat A1C pending. 

## 2020-05-16 NOTE — Patient Instructions (Addendum)
[facility] to schedule your mammogram.   You will be contacted regarding your referral to GI for the colonoscopy.  Please let us know if you have not been contacted within two weeks.   Stop by the lab prior to leaving today. I will notify you of your results once received.   Start exercising. You should be getting 150 minutes of moderate intensity exercise weekly.  It's important to improve your diet by reducing consumption of fast food, fried food, processed snack foods, sugary drinks. Increase consumption of fresh vegetables and fruits, whole grains, water.  Ensure you are drinking 64 ounces of water daily..  It was a pleasure to see you today!   Preventive Care 12-74 Years Old, Female Preventive care refers to visits with your health care provider and lifestyle choices that can promote health and wellness. This includes:  A yearly physical exam. This may also be called an annual well check.  Regular dental visits and eye exams.  Immunizations.  Screening for certain conditions.  Healthy lifestyle choices, such as eating a healthy diet, getting regular exercise, not using drugs or products that contain nicotine and tobacco, and limiting alcohol use. What can I expect for my preventive care visit? Physical exam Your health care provider will check your:  Height and weight. This may be used to calculate body mass index (BMI), which tells if you are at a healthy weight.  Heart rate and blood pressure.  Skin for abnormal spots. Counseling Your health care provider may ask you questions about your:  Alcohol, tobacco, and drug use.  Emotional well-being.  Home and relationship well-being.  Sexual activity.  Eating habits.  Work and work Statistician.  Method of birth control.  Menstrual cycle.  Pregnancy history. What immunizations do I need?  Influenza (flu) vaccine  This is recommended every year. Tetanus, diphtheria, and pertussis (Tdap)  vaccine  You may need a Td booster every 10 years. Varicella (chickenpox) vaccine  You may need this if you have not been vaccinated. Zoster (shingles) vaccine  You may need this after age 33. Measles, mumps, and rubella (MMR) vaccine  You may need at least one dose of MMR if you were born in 1957 or later. You may also need a second dose. Pneumococcal conjugate (PCV13) vaccine  You may need this if you have certain conditions and were not previously vaccinated. Pneumococcal polysaccharide (PPSV23) vaccine  You may need one or two doses if you smoke cigarettes or if you have certain conditions. Meningococcal conjugate (MenACWY) vaccine  You may need this if you have certain conditions. Hepatitis A vaccine  You may need this if you have certain conditions or if you travel or work in places where you may be exposed to hepatitis A. Hepatitis B vaccine  You may need this if you have certain conditions or if you travel or work in places where you may be exposed to hepatitis B. Haemophilus influenzae type b (Hib) vaccine  You may need this if you have certain conditions. Human papillomavirus (HPV) vaccine  If recommended by your health care provider, you may need three doses over 6 months. You may receive vaccines as individual doses or as more than one vaccine together in one shot (combination vaccines). Talk with your health care provider about the risks and benefits of combination vaccines. What tests do I need? Blood tests  Lipid and cholesterol levels. These may be checked every 5 years, or more frequently if you are over 50 years  old.  Hepatitis C test.  Hepatitis B test. Screening  Lung cancer screening. You may have this screening every year starting at age 43 if you have a 30-pack-year history of smoking and currently smoke or have quit within the past 15 years.  Colorectal cancer screening. All adults should have this screening starting at age 85 and continuing until  age 39. Your health care provider may recommend screening at age 69 if you are at increased risk. You will have tests every 1-10 years, depending on your results and the type of screening test.  Diabetes screening. This is done by checking your blood sugar (glucose) after you have not eaten for a while (fasting). You may have this done every 1-3 years.  Mammogram. This may be done every 1-2 years. Talk with your health care provider about when you should start having regular mammograms. This may depend on whether you have a family history of breast cancer.  BRCA-related cancer screening. This may be done if you have a family history of breast, ovarian, tubal, or peritoneal cancers.  Pelvic exam and Pap test. This may be done every 3 years starting at age 71. Starting at age 75, this may be done every 5 years if you have a Pap test in combination with an HPV test. Other tests  Sexually transmitted disease (STD) testing.  Bone density scan. This is done to screen for osteoporosis. You may have this scan if you are at high risk for osteoporosis. Follow these instructions at home: Eating and drinking  Eat a diet that includes fresh fruits and vegetables, whole grains, lean protein, and low-fat dairy.  Take vitamin and mineral supplements as recommended by your health care provider.  Do not drink alcohol if: ? Your health care provider tells you not to drink. ? You are pregnant, may be pregnant, or are planning to become pregnant.  If you drink alcohol: ? Limit how much you have to 0-1 drink a day. ? Be aware of how much alcohol is in your drink. In the [place] one drink equals one 12 oz bottle of beer (355 mL), one 5 oz glass of wine (148 mL), or one 1 oz glass of hard liquor (44 mL). Lifestyle  Take daily care of your teeth and gums.  Stay active. Exercise for at least 30 minutes on 5 or more days each week.  Do not use any products that contain nicotine or tobacco, such as cigarettes,  e-cigarettes, and chewing tobacco. If you need help quitting, ask your health care provider.  If you are sexually active, practice safe sex. Use a condom or other form of birth control (contraception) in order to prevent pregnancy and STIs (sexually transmitted infections).  If told by your health care provider, take low-dose aspirin daily starting at age 63. What's next?  Visit your health care provider once a year for a well check visit.  Ask your health care provider how often you should have your eyes and teeth checked.  Stay up to date on all vaccines. This information is not intended to replace advice given to you by your health care provider. Make sure you discuss any questions you have with your health care provider. Document Revised: 01/23/2018 Document Reviewed: 01/23/2018 Elsevier Patient Education  2020 [employer].   Call for your mammogram  '[]'   2D Mammogram  '[x]'   3D Mammogram  '[]'   Bone Density   Call for appointment    Your appointment will at the following location  '[]'   [facility]  [place] [address]  [phone]  '[]'   [place] [facility]   [address] Room [place], [place] [address]  [phone]  '[x]'   [facility]      [address] [place], [place]         '[]'   [facility]  [place], [place]  '[]'  [facility] [place] [facility]   [address], [place] [address]  '[]'  [place]  [address], [place] [address] [phone]    Make sure to wear two peace clothing  No lotions powders or deodorants the day of the appointment Make sure to bring picture ID and insurance card.  Bring list of medications you are currently taking including any  supplements.

## 2020-05-16 NOTE — Assessment & Plan Note (Signed)
Could be secondary to HCTZ use, but UA today with trace blood. Culture sent.  Consider further work up and/or repeat UA if culture negative given trace blood.

## 2020-05-16 NOTE — Assessment & Plan Note (Signed)
Improved with better control of hypertension. Continue to monitor.

## 2020-05-16 NOTE — Assessment & Plan Note (Signed)
Improved with famotidine 20 mg BID, sometimes with breakthrough symptoms. She would like to give it some additional time and update. Consider PPI if needed.

## 2020-05-16 NOTE — Progress Notes (Signed)
Subjective:    Patient ID: Stacey Murray, female    DOB: Jan 02, 1961, 59 y.o.   MRN: [id number]  HPI  This visit occurred during the SARS-CoV-2 public health emergency.  Safety protocols were in place, including screening questions prior to the visit, additional usage of staff PPE, and extensive cleaning of exam room while observing appropriate contact time as indicated for disinfecting solutions.   Stacey Murray is a 59 year old female who presents today for complete physical and follow up of hypertension.  Immunizations: -Tetanus: Completed in 2017 -Influenza: Completed this season  -Shingles: Completed series -Covid-19: Completed series  Diet: She endorses a fair diet.  Exercise: She is not exercising.   Eye exam: No recent exam Dental exam: No recent exam  Pap Smear: Hysterectomy  Mammogram: Due Colonoscopy: Never completed Hep C Screen: Negative  BP Readings from Last 3 Encounters:  05/16/20 134/72  04/29/20 (!) 166/114  03/06/19 136/82   She is checking her BP at home which is running 130's/70's-80's. She's also noticed a darker color to her urine for the last week. She's been compliant to HCTZ daily as prescribed, drinks 2-3 glasses of water daily. Denies hematuria, dysuria, vaginal symptoms.   Review of Systems  Constitutional: Negative for unexpected weight change.  HENT: Negative for rhinorrhea.   Eyes: Positive for visual disturbance.  Respiratory: Negative for cough and shortness of breath.   Cardiovascular: Negative for chest pain.  Gastrointestinal: Negative for constipation and diarrhea.  Genitourinary: Negative for difficulty urinating.  Musculoskeletal: Negative for arthralgias and myalgias.  Skin: Negative for rash.  Allergic/Immunologic: Negative for environmental allergies.  Neurological: Negative for dizziness, numbness and headaches.  Psychiatric/Behavioral:       Some depression at times, overall able to manage on her own. She has no concerns.        Past Medical History:  Diagnosis Date  . Classic migraine with aura 09/05/2017  . Frequent headaches   . Heart murmur   . Hyperlipidemia   . Hypertension   . Migraine      Social History   Socioeconomic History  . Marital status: Married    Spouse name: Not on file  . Number of children: Not on file  . Years of education: Not on file  . Highest education level: Not on file  Occupational History  . Occupation: [employer]  Tobacco Use  . Smoking status: Never Smoker  . Smokeless tobacco: Never Used  Vaping Use  . Vaping Use: Never used  Substance and Sexual Activity  . Alcohol use: Yes    Comment: rare  . Drug use: No  . Sexual activity: Not on file  Other Topics Concern  . Not on file  Social History Narrative   Lives w/ spouse   Caffeine use: coffee/tea/soda daily   Right handed    Married.   6 children. 1 grandchild.   Works at [employer].   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   Social Determinants of Health   Financial Resource Strain: Not on file  Food Insecurity: Not on file  Transportation Needs: Not on file  Physical Activity: Not on file  Stress: Not on file  Social Connections: Not on file  Intimate Partner Violence: Not on file    Past Surgical History:  Procedure Laterality Date  . ABDOMINAL HYSTERECTOMY  2010    Family History  Problem Relation Age of Onset  . Hyperlipidemia Mother   . Heart disease Mother   . Hypertension Mother   .  Heart disease Maternal Grandmother   . Hyperlipidemia Maternal Grandmother   . Hypertension Maternal Grandmother   . Liver disease Father        ETOH  . Lupus Sister   . Intracerebral hemorrhage Sister     Allergies  Allergen Reactions  . Penicillins Other (See Comments)    unknown    Current Outpatient Medications on File Prior to Visit  Medication Sig Dispense Refill  . Ascorbic Acid (VITAMIN C PO) Take by mouth.    . Cyanocobalamin (VITAMIN B-12 PO) Take by mouth.    . famotidine (PEPCID) 20 MG  tablet Take 1 tablet (20 mg total) by mouth 2 (two) times daily. For heartburn. 60 tablet 0  . hydrochlorothiazide (HYDRODIURIL) 12.5 MG tablet Take 1 tablet (12.5 mg total) by mouth daily. For blood pressure. 30 tablet 0  . Multiple Vitamin (MULTIVITAMIN ADULT PO) Take by mouth.    . Niacin (VITAMIN B-3 PO)     . VITAMIN D-VITAMIN K PO Take by mouth.     No current facility-administered medications on file prior to visit.    BP 134/72   Pulse 98   Temp 97.6 F (36.4 C) (Temporal)   Ht 5\' 3"  (1.6 m)   Wt 188 lb (85.3 kg)   SpO2 97%   BMI 33.30 kg/m    Objective:   Physical Exam Constitutional:      Appearance: She is well-nourished.  HENT:     Right Ear: Tympanic membrane and ear canal normal.     Left Ear: Tympanic membrane and ear canal normal.     Mouth/Throat:     Mouth: Oropharynx is clear and moist.  Eyes:     Extraocular Movements: EOM normal.     Pupils: Pupils are equal, round, and reactive to light.  Cardiovascular:     Rate and Rhythm: Normal rate and regular rhythm.  Pulmonary:     Effort: Pulmonary effort is normal.     Breath sounds: Normal breath sounds.  Abdominal:     General: Bowel sounds are normal.     Palpations: Abdomen is soft.     Tenderness: There is no abdominal tenderness.  Musculoskeletal:        General: Normal range of motion.     Cervical back: Neck supple.  Skin:    General: Skin is warm and dry.  Neurological:     Mental Status: She is alert and oriented to person, place, and time.     Cranial Nerves: No cranial nerve deficit.     Deep Tendon Reflexes:     Reflex Scores:      Patellar reflexes are 2+ on the right side and 2+ on the left side. Psychiatric:        Mood and Affect: Mood and affect and mood normal.            Assessment & Plan:

## 2020-05-16 NOTE — Assessment & Plan Note (Signed)
Discussed the importance of a healthy diet and regular exercise in order for weight loss, and to reduce the risk of any potential medical problems.  Repeat lipid panel pending. 

## 2020-05-17 LAB — URINE CULTURE
MICRO NUMBER:: 11338224
Result:: NO GROWTH
SPECIMEN QUALITY:: ADEQUATE

## 2020-05-23 ENCOUNTER — Other Ambulatory Visit: Payer: Self-pay

## 2020-05-23 MED ORDER — METFORMIN HCL ER 500 MG PO TB24: 500.0000 mg | ORAL_TABLET | Freq: Every day | ORAL | 1 refills | Status: DC

## 2020-05-23 MED ORDER — ATORVASTATIN CALCIUM 20 MG PO TABS
20.0000 mg | ORAL_TABLET | Freq: Every day | ORAL | 3 refills | Status: DC
Start: 2020-05-23 — End: 2020-09-06

## 2020-05-24 ENCOUNTER — Other Ambulatory Visit: Payer: Self-pay | Admitting: Primary Care

## 2020-05-24 DIAGNOSIS — I10 Essential (primary) hypertension: Secondary | ICD-10-CM

## 2020-05-24 DIAGNOSIS — K219 Gastro-esophageal reflux disease without esophagitis: Secondary | ICD-10-CM

## 2020-05-25 NOTE — Telephone Encounter (Signed)
Pharmacy requests refill on: Famotidine 20 mg & Hydrochlorothiazide 12.5 mg  LAST REFILL: 04/29/2020 LAST OV: 05/16/2020 NEXT OV: 08/22/2020 PHARMACY: [id number] [place], [place]

## 2020-07-19 ENCOUNTER — Other Ambulatory Visit: Payer: Self-pay

## 2020-07-19 DIAGNOSIS — K219 Gastro-esophageal reflux disease without esophagitis: Secondary | ICD-10-CM

## 2020-07-19 MED ORDER — FAMOTIDINE 20 MG PO TABS: ORAL_TABLET | ORAL | 0 refills | Status: DC

## 2020-08-22 ENCOUNTER — Ambulatory Visit: Payer: Managed Care, Other (non HMO) | Admitting: Primary Care

## 2020-09-01 ENCOUNTER — Encounter: Payer: Self-pay | Admitting: Primary Care

## 2020-09-02 ENCOUNTER — Other Ambulatory Visit: Payer: Self-pay

## 2020-09-02 ENCOUNTER — Encounter: Payer: Self-pay | Admitting: Primary Care

## 2020-09-02 ENCOUNTER — Ambulatory Visit: Payer: Managed Care, Other (non HMO) | Admitting: Primary Care

## 2020-09-02 VITALS — BP 144/86 | HR 88 | Temp 98.7°F | Ht 63.0 in | Wt 180.0 lb

## 2020-09-02 DIAGNOSIS — E782 Mixed hyperlipidemia: Secondary | ICD-10-CM | POA: Diagnosis not present

## 2020-09-02 DIAGNOSIS — E119 Type 2 diabetes mellitus without complications: Secondary | ICD-10-CM | POA: Diagnosis not present

## 2020-09-02 DIAGNOSIS — Z114 Encounter for screening for human immunodeficiency virus [HIV]: Secondary | ICD-10-CM

## 2020-09-02 DIAGNOSIS — R7303 Prediabetes: Secondary | ICD-10-CM

## 2020-09-02 DIAGNOSIS — I10 Essential (primary) hypertension: Secondary | ICD-10-CM

## 2020-09-02 LAB — POCT GLYCOSYLATED HEMOGLOBIN (HGB A1C): Hemoglobin A1C: 6.4 % — AB (ref 4.0–5.6)

## 2020-09-02 LAB — HEPATIC FUNCTION PANEL
ALT: 28 U/L (ref 0–35)
AST: 21 U/L (ref 0–37)
Albumin: 4.2 g/dL (ref 3.5–5.2)
Alkaline Phosphatase: 55 U/L (ref 39–117)
Bilirubin, Direct: 0.2 mg/dL (ref 0.0–0.3)
Total Bilirubin: 0.5 mg/dL (ref 0.2–1.2)
Total Protein: 8.2 g/dL (ref 6.0–8.3)

## 2020-09-02 LAB — LIPID PANEL
Cholesterol: 183 mg/dL (ref 0–200)
HDL: 50.4 mg/dL (ref >39.00)
LDL Cholesterol: 114 mg/dL — ABNORMAL HIGH (ref 0–99)
NonHDL: 132.79
Total CHOL/HDL Ratio: 4
Triglycerides: 96 mg/dL (ref 0.0–149.0)
VLDL: 19.2 mg/dL (ref 0.0–40.0)

## 2020-09-02 LAB — MICROALBUMIN / CREATININE URINE RATIO
Creatinine,U: 214.7 mg/dL
Microalb Creat Ratio: 2.7 mg/g (ref 0.0–30.0)
Microalb, Ur: 5.8 mg/dL — ABNORMAL HIGH (ref 0.0–1.9)

## 2020-09-02 NOTE — Patient Instructions (Signed)
Stop by the lab prior to leaving today. I will notify you of your results once received.   Continue taking Metformin for diabetes and atorvastatin for cholesterol.  Monitor your blood pressure which should run less than 130/90.  Please schedule a follow up appointment in 6 months for diabetes check.  It was a pleasure to see you today!

## 2020-09-02 NOTE — Progress Notes (Signed)
Subjective:    Patient ID: Stacey Murray, female    DOB: 1961-02-14, 60 y.o.   MRN: [id number]  HPI  Stacey Murray is a very pleasant 60 y.o. female who presents today for follow up of diabetes and hypertension.  1) Type 2 Diabetes:  Current medications include: Metformin XR 500 mg daily.   Last A1C: 6.6 in December 2021 Last Eye Exam: Due  Last Foot Exam: Due Pneumonia Vaccination:Due ACE/ARB: None, urine microalbumin due Statin: atorvastatin   2) Essential Hypertension:   Currently managed on HCTZ 12.5 mg. She's been checking BP at home which is running 130's/80's-90's. Urinary frequency from last visit has resolved.   She is not exercising, endorses a somewhat improved diet.   BP Readings from Last 3 Encounters:  09/02/20 (!) 144/86  05/16/20 134/72  04/29/20 (!) 166/114   Wt Readings from Last 3 Encounters:  09/02/20 180 lb (81.6 kg)  05/16/20 188 lb (85.3 kg)  04/29/20 186 lb (84.4 kg)          Review of Systems  Respiratory: Negative for shortness of breath.   Cardiovascular: Negative for chest pain.  Neurological: Negative for dizziness and numbness.         Past Medical History:  Diagnosis Date  . Classic migraine with aura 09/05/2017  . Frequent headaches   . Heart murmur   . Hyperlipidemia   . Hypertension   . Migraine     Social History   Socioeconomic History  . Marital status: Married    Spouse name: Not on file  . Number of children: Not on file  . Years of education: Not on file  . Highest education level: Not on file  Occupational History  . Occupation: [employer]  Tobacco Use  . Smoking status: Never Smoker  . Smokeless tobacco: Never Used  Vaping Use  . Vaping Use: Never used  Substance and Sexual Activity  . Alcohol use: Yes    Comment: rare  . Drug use: No  . Sexual activity: Not on file  Other Topics Concern  . Not on file  Social History Narrative   Lives w/ spouse   Caffeine use: coffee/tea/soda daily   Right  handed    Married.   6 children. 1 grandchild.   Works at [employer].   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   Social Determinants of Health   Financial Resource Strain: Not on file  Food Insecurity: Not on file  Transportation Needs: Not on file  Physical Activity: Not on file  Stress: Not on file  Social Connections: Not on file  Intimate Partner Violence: Not on file    Past Surgical History:  Procedure Laterality Date  . ABDOMINAL HYSTERECTOMY  2010    Family History  Problem Relation Age of Onset  . Hyperlipidemia Mother   . Heart disease Mother   . Hypertension Mother   . Heart disease Maternal Grandmother   . Hyperlipidemia Maternal Grandmother   . Hypertension Maternal Grandmother   . Liver disease Father        ETOH  . Lupus Sister   . Intracerebral hemorrhage Sister     Allergies  Allergen Reactions  . Penicillins Other (See Comments)    unknown    Current Outpatient Medications on File Prior to Visit  Medication Sig Dispense Refill  . Ascorbic Acid (VITAMIN C PO) Take by mouth.    Marland Kitchen atorvastatin (LIPITOR) 20 MG tablet Take 1 tablet (20 mg total) by mouth daily. 90 tablet 3  .  Cyanocobalamin (VITAMIN B-12 PO) Take by mouth.    . famotidine (PEPCID) 20 MG tablet TAKE 1 TABLET BY MOUTH TWICE A DAY FOR HEARTBURN 180 tablet 0  . hydrochlorothiazide (HYDRODIURIL) 12.5 MG tablet TAKE 1 TABLET BY MOUTH EVERY DAY FOR BLOOD PRESSURE 90 tablet 1  . metFORMIN (GLUCOPHAGE XR) 500 MG 24 hr tablet Take 1 tablet (500 mg total) by mouth daily with breakfast. 90 tablet 1  . Multiple Vitamin (MULTIVITAMIN ADULT PO) Take by mouth.    . Niacin (VITAMIN B-3 PO)     . VITAMIN D-VITAMIN K PO Take by mouth.     No current facility-administered medications on file prior to visit.    BP (!) 144/86   Pulse 88   Temp 98.7 F (37.1 C) (Temporal)   Ht 5\' 3"  (1.6 m)   Wt 180 lb (81.6 kg)   SpO2 97%   BMI 31.89 kg/m  Objective:   Physical Exam Cardiovascular:     Rate and  Rhythm: Normal rate and regular rhythm.  Pulmonary:     Effort: Pulmonary effort is normal.     Breath sounds: Normal breath sounds.  Musculoskeletal:     Cervical back: Neck supple.  Skin:    General: Skin is warm and dry.           Assessment & Plan:      This visit occurred during the SARS-CoV-2 public health emergency.  Safety protocols were in place, including screening questions prior to the visit, additional usage of staff PPE, and extensive cleaning of exam room while observing appropriate contact time as indicated for disinfecting solutions.

## 2020-09-02 NOTE — Assessment & Plan Note (Signed)
A1C today of 6.4 which is an improvement from last visit. Continue Metformin XR 500 mg.  She will schedule an eye exam. Foot exam next visit. Urine microalbumin due today.  Follow up in 6 months.

## 2020-09-02 NOTE — Assessment & Plan Note (Signed)
Above goal today, home readings are slightly better.  Discussed options for treatment including adding lisinopril to HCTZ vs lifestyle changes, she opts for lifestyle changes.   She will monitor BP at home and notify if readings remain at or above 130/90 after lifestyle changes.  Continue HCTZ 12.5 mg daily.

## 2020-09-05 DIAGNOSIS — E119 Type 2 diabetes mellitus without complications: Secondary | ICD-10-CM

## 2020-09-05 DIAGNOSIS — E782 Mixed hyperlipidemia: Secondary | ICD-10-CM

## 2020-09-05 LAB — HIV ANTIBODY (ROUTINE TESTING W REFLEX): HIV 1&2 Ab, 4th Generation: NONREACTIVE

## 2020-09-06 MED ORDER — ATORVASTATIN CALCIUM 40 MG PO TABS: 40.0000 mg | ORAL_TABLET | Freq: Every day | ORAL | 3 refills | Status: DC

## 2020-09-07 MED ORDER — FREESTYLE LIBRE 14 DAY SENSOR MISC: 1 refills | Status: DC

## 2020-09-07 MED ORDER — FREESTYLE LIBRE 14 DAY READER DEVI: 0 refills | Status: DC

## 2020-09-16 NOTE — Telephone Encounter (Signed)
Have updated pharmacy. No other action needed.

## 2020-09-19 ENCOUNTER — Other Ambulatory Visit: Payer: Self-pay | Admitting: Primary Care

## 2020-09-19 DIAGNOSIS — K219 Gastro-esophageal reflux disease without esophagitis: Secondary | ICD-10-CM

## 2020-10-27 ENCOUNTER — Other Ambulatory Visit: Payer: Self-pay | Admitting: Primary Care

## 2020-10-27 DIAGNOSIS — Z1231 Encounter for screening mammogram for malignant neoplasm of breast: Secondary | ICD-10-CM

## 2020-10-29 ENCOUNTER — Other Ambulatory Visit: Payer: Self-pay

## 2020-10-29 ENCOUNTER — Ambulatory Visit
Admission: RE | Admit: 2020-10-29 | Discharge: 2020-10-29 | Disposition: A | Discharge status: Home / Self Care | Payer: Managed Care, Other (non HMO) | Source: Ambulatory Visit | Attending: Primary Care | Admitting: Primary Care

## 2020-10-29 DIAGNOSIS — Z1231 Encounter for screening mammogram for malignant neoplasm of breast: Secondary | ICD-10-CM

## 2020-11-02 ENCOUNTER — Other Ambulatory Visit: Payer: Self-pay | Admitting: Primary Care

## 2020-11-02 DIAGNOSIS — R928 Other abnormal and inconclusive findings on diagnostic imaging of breast: Secondary | ICD-10-CM

## 2020-11-08 ENCOUNTER — Ambulatory Visit: Payer: Managed Care, Other (non HMO) | Admitting: Primary Care

## 2020-11-10 ENCOUNTER — Other Ambulatory Visit: Payer: Self-pay | Admitting: Primary Care

## 2020-11-10 DIAGNOSIS — I10 Essential (primary) hypertension: Secondary | ICD-10-CM

## 2020-11-10 DIAGNOSIS — E119 Type 2 diabetes mellitus without complications: Secondary | ICD-10-CM

## 2020-11-21 NOTE — Telephone Encounter (Signed)
Spoke to pt. Offered to find her an appt with a virtual provider today or advised she could go to UC. She will go to [facility] at the main [facility].

## 2020-11-22 NOTE — Telephone Encounter (Signed)
Noted  

## 2020-11-23 ENCOUNTER — Ambulatory Visit: Payer: Managed Care, Other (non HMO) | Admitting: Primary Care

## 2020-11-23 DIAGNOSIS — Z0289 Encounter for other administrative examinations: Secondary | ICD-10-CM

## 2020-12-05 ENCOUNTER — Other Ambulatory Visit: Payer: Self-pay

## 2020-12-05 ENCOUNTER — Ambulatory Visit
Admission: RE | Admit: 2020-12-05 | Discharge: 2020-12-05 | Disposition: A | Discharge status: Home / Self Care | Payer: Managed Care, Other (non HMO) | Source: Ambulatory Visit | Attending: Primary Care | Admitting: Primary Care

## 2020-12-05 ENCOUNTER — Other Ambulatory Visit: Payer: Self-pay | Admitting: Primary Care

## 2020-12-05 DIAGNOSIS — R928 Other abnormal and inconclusive findings on diagnostic imaging of breast: Secondary | ICD-10-CM

## 2020-12-05 DIAGNOSIS — N6320 Unspecified lump in the left breast, unspecified quadrant: Secondary | ICD-10-CM

## 2020-12-12 ENCOUNTER — Ambulatory Visit
Admission: RE | Admit: 2020-12-12 | Discharge: 2020-12-12 | Disposition: A | Discharge status: Home / Self Care | Payer: Managed Care, Other (non HMO) | Source: Ambulatory Visit | Attending: Primary Care | Admitting: Primary Care

## 2020-12-12 ENCOUNTER — Other Ambulatory Visit: Payer: Self-pay

## 2020-12-12 DIAGNOSIS — N6320 Unspecified lump in the left breast, unspecified quadrant: Secondary | ICD-10-CM

## 2021-01-09 LAB — HEMOGLOBIN A1C: Hemoglobin A1C: 6.6

## 2021-01-09 LAB — LIPID PANEL
Cholesterol: 175 (ref 0–200)
HDL: 56 (ref 35–70)
LDL Cholesterol: 101
LDl/HDL Ratio: 3.1
Triglycerides: 101 (ref 40–160)

## 2021-01-09 LAB — BASIC METABOLIC PANEL
Creatinine: 0.9 (ref 0.5–1.1)
Glucose: 122

## 2021-01-12 ENCOUNTER — Encounter: Payer: Self-pay | Admitting: Primary Care

## 2021-01-12 NOTE — Telephone Encounter (Signed)
FYI

## 2021-01-12 NOTE — Telephone Encounter (Signed)
Printed to abstract will put in [place] box for review. No action needed on this message.

## 2021-03-07 ENCOUNTER — Other Ambulatory Visit: Payer: Self-pay | Admitting: Primary Care

## 2021-03-07 ENCOUNTER — Encounter: Payer: Self-pay | Admitting: Primary Care

## 2021-03-07 ENCOUNTER — Ambulatory Visit: Payer: Managed Care, Other (non HMO) | Admitting: Primary Care

## 2021-03-07 ENCOUNTER — Other Ambulatory Visit: Payer: Self-pay

## 2021-03-07 VITALS — BP 150/88 | HR 103 | Temp 98.7°F | Ht 63.0 in | Wt 187.0 lb

## 2021-03-07 DIAGNOSIS — E119 Type 2 diabetes mellitus without complications: Secondary | ICD-10-CM

## 2021-03-07 DIAGNOSIS — Z23 Encounter for immunization: Secondary | ICD-10-CM

## 2021-03-07 DIAGNOSIS — I10 Essential (primary) hypertension: Secondary | ICD-10-CM | POA: Diagnosis not present

## 2021-03-07 MED ORDER — LOSARTAN POTASSIUM-HCTZ 50-12.5 MG PO TABS: 1.0000 | ORAL_TABLET | Freq: Every day | ORAL | 0 refills | Status: DC

## 2021-03-07 MED ORDER — FREESTYLE LIBRE 2 SENSOR MISC: 1 refills | Status: AC

## 2021-03-07 MED ORDER — FREESTYLE LIBRE 2 READER DEVI: 0 refills | Status: AC

## 2021-03-07 NOTE — Patient Instructions (Signed)
Stop taking hydrochlorothiazide 12.5 mg once daily for blood pressure.  Start taking losartan-hydrochlorothiazide 50-12.5 mg once daily for blood pressure.  Continue taking Metformin XR 500 mg once daily for diabetes.  It is important that you improve your diet. Please limit carbohydrates in the form of white bread, rice, pasta, sweets, fast food, fried food, sugary drinks, etc. Increase your consumption of fresh fruits and vegetables, whole grains, lean protein.  Ensure you are consuming 64 ounces of water daily.  We will see you in 3 weeks for a blood pressure check.  It was a pleasure to see you today!

## 2021-03-07 NOTE — Progress Notes (Signed)
Subjective:    Patient ID: Stacey Murray, female    DOB: 04-28-1961, 59 y.o.   MRN: [id number]  HPI  Stacey Murray is a very pleasant 60 y.o. female with a history of hypertension, type 2 diabetes, hyperlipidemia who presents today for follow up of type 2 diabetes and hypertension.  1) Type 2 Diabetes:  Current medications include: Metformin XR 500 mg daily. She does notice intermittent bloating.    Last A1C: 6.6 in August 2022 Last Eye Exam: Due Last Foot Exam: Due Pneumonia Vaccination: Never completed, declines  Urine Microalbumin: UTD Statin: atorvastatin   Dietary changes since last visit: She is working on portion control.    Exercise: She is not exercising.   2) Essential Hypertension: Currently managed on HCTZ 12.5 mg once daily. Two elevated readings since April 2022. She does not check BP at home. 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 She has also noticed dizziness when laying down.   She endorses a poor diet and little physical activity since working from home. She drinks mostly coffee.   BP Readings from Last 3 Encounters:  03/07/21 (!) 150/88  09/02/20 (!) 144/86  05/16/20 134/72     Review of Systems  Eyes:  Positive for visual disturbance.  Respiratory:  Negative for shortness of breath.   Cardiovascular:  Negative for chest pain.  Skin:  Negative for color change.  Neurological:  Positive for numbness.        Past Medical History:  Diagnosis Date   Classic migraine with aura 09/05/2017   Frequent headaches    Heart murmur    Hyperlipidemia    Hypertension    Migraine     Social History   Socioeconomic History   Marital status: Married    Spouse name: Not on file   Number of children: Not on file   Years of education: Not on file   Highest education level: Not on file  Occupational History   Occupation: [employer]  Tobacco Use   Smoking status: Never   Smokeless tobacco: Never  Vaping Use   Vaping Use: Never  used  Substance and Sexual Activity   Alcohol use: Yes    Comment: rare   Drug use: No   Sexual activity: Not on file  Other Topics Concern   Not on file  Social History Narrative   Lives w/ spouse   Caffeine use: coffee/tea/soda daily   Right handed    Married.   6 children. 1 grandchild.   Works at [employer].   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   Social Determinants of Health   Financial Resource Strain: Not on file  Food Insecurity: Not on file  Transportation Needs: Not on file  Physical Activity: Not on file  Stress: Not on file  Social Connections: Not on file  Intimate Partner Violence: Not on file    Past Surgical History:  Procedure Laterality Date   ABDOMINAL HYSTERECTOMY  2010    Family History  Problem Relation Age of Onset   Hyperlipidemia Mother    Heart disease Mother    Hypertension Mother    Heart disease Maternal Grandmother    Hyperlipidemia Maternal Grandmother    Hypertension Maternal Grandmother    Liver disease Father        ETOH   Lupus Sister    Intracerebral hemorrhage Sister     Allergies  Allergen Reactions   Penicillins Other (See Comments)    unknown    Current Outpatient  Medications on File Prior to Visit  Medication Sig Dispense Refill   Ascorbic Acid (VITAMIN C PO) Take by mouth.     atorvastatin (LIPITOR) 40 MG tablet Take 1 tablet (40 mg total) by mouth daily. For cholesterol. 90 tablet 3   Continuous Blood Gluc Receiver (FREESTYLE LIBRE 14 DAY READER) DEVI Use with sensor to check blood sugar. 1 each 0   Continuous Blood Gluc Sensor (FREESTYLE LIBRE 14 DAY SENSOR) MISC Use as directed to check blood sugars. 6 each 1   Cyanocobalamin (VITAMIN B-12 PO) Take by mouth.     famotidine (PEPCID) 20 MG tablet TAKE 1 TABLET BY MOUTH  TWICE DAILY FOR HEARTBURN 180 tablet 1   hydrochlorothiazide (HYDRODIURIL) 12.5 MG tablet TAKE 1 TABLET BY MOUTH EVERY DAY FOR BLOOD PRESSURE 90 tablet 1   metFORMIN (GLUCOPHAGE-XR) 500 MG 24 hr  tablet Take 1 tablet (500 mg total) by mouth daily with breakfast. For diabetes. 90 tablet 1   Multiple Vitamin (MULTIVITAMIN ADULT PO) Take by mouth.     Niacin (VITAMIN B-3 PO)      VITAMIN D-VITAMIN K PO Take by mouth.     No current facility-administered medications on file prior to visit.    BP (!) 150/88   Pulse (!) 103   Temp 98.7 F (37.1 C) (Temporal)   Ht 5\' 3"  (1.6 m)   Wt 187 lb (84.8 kg)   SpO2 97%   BMI 33.13 kg/m  Objective:   Physical Exam Cardiovascular:     Rate and Rhythm: Normal rate and regular rhythm.  Pulmonary:     Effort: Pulmonary effort is normal.     Breath sounds: Normal breath sounds.  Musculoskeletal:     Cervical back: Neck supple.  Skin:    General: Skin is warm and dry.          Assessment & Plan:      This visit occurred during the SARS-CoV-2 public health emergency.  Safety protocols were in place, including screening questions prior to the visit, additional usage of staff PPE, and extensive cleaning of exam room while observing appropriate contact time as indicated for disinfecting solutions.

## 2021-03-07 NOTE — Assessment & Plan Note (Signed)
Controlled compared to labs from August 2022 Through employer.   Continue Metformin XR 500 mg. Will provide pneumonia vaccine at next visit. Foot exam today. Discussed to schedule eye exam. Added ARB today. Continue statin.  Follow up in 3-6 months.

## 2021-03-07 NOTE — Assessment & Plan Note (Signed)
Above goal today, also during visit in April 2022.  Add losartan 50 mg to HCTZ 12.5 mg. We will see her back in 2-3 weeks for BP check and BMP.  Discussed the importance of a healthy diet and regular exercise in order for weight loss, and to reduce the risk of further co-morbidity.

## 2021-03-28 ENCOUNTER — Ambulatory Visit: Payer: Managed Care, Other (non HMO) | Admitting: Primary Care

## 2021-03-28 ENCOUNTER — Other Ambulatory Visit: Payer: Self-pay

## 2021-03-28 ENCOUNTER — Encounter: Payer: Self-pay | Admitting: Primary Care

## 2021-03-28 VITALS — BP 140/86 | HR 93 | Temp 98.0°F | Ht 63.0 in | Wt 185.4 lb

## 2021-03-28 DIAGNOSIS — I10 Essential (primary) hypertension: Secondary | ICD-10-CM | POA: Diagnosis not present

## 2021-03-28 DIAGNOSIS — G8929 Other chronic pain: Secondary | ICD-10-CM | POA: Diagnosis not present

## 2021-03-28 DIAGNOSIS — R21 Rash and other nonspecific skin eruption: Secondary | ICD-10-CM | POA: Insufficient documentation

## 2021-03-28 DIAGNOSIS — R109 Unspecified abdominal pain: Secondary | ICD-10-CM

## 2021-03-28 LAB — BASIC METABOLIC PANEL
BUN: 14 mg/dL (ref 6–23)
CO2: 30 mEq/L (ref 19–32)
Calcium: 9.4 mg/dL (ref 8.4–10.5)
Chloride: 100 mEq/L (ref 96–112)
Creatinine, Ser: 0.87 mg/dL (ref 0.40–1.20)
GFR: 71.55 mL/min (ref >60.00)
Glucose, Bld: 142 mg/dL — ABNORMAL HIGH (ref 70–99)
Potassium: 3.7 mEq/L (ref 3.5–5.1)
Sodium: 138 mEq/L (ref 135–145)

## 2021-03-28 NOTE — Assessment & Plan Note (Signed)
Improved but still above goal.  Recommended dose increase of losartan to 100 mg but she would like to work on lifestyle changes.   Continue losartan-HCTZ 50-12.5 mg. Checking BMP today.  Will send refills.  She will send BP readings in 2-3 weeks via MyChart.

## 2021-03-28 NOTE — Assessment & Plan Note (Signed)
Could be hernia. No obvious hernia noted on exam.  Abdominal ultrasound ordered and pending. She is in no distress.

## 2021-03-28 NOTE — Progress Notes (Signed)
Subjective:    Patient ID: Stacey Murray, female    DOB: 01/10/1961, 60 y.o.   MRN: [id number]  HPI  Coumba Kellison is a very pleasant 60 y.o. female with a history of hypertension, migraines, type 2 diabetes, hyperlipidemia who presents today for follow up of hypertension. She would also like to discuss chronic rash and abdominal pain.  1) Hypertension:   She was last evaluated on 03/07/21 for follow up of diabetes and hypertension. BP above goal despite management on 12.5 mg of HCTZ daily. Given this we added losartan 50 mg and asked her to follow up today.  Since her last visit she does not check her BP at home as she recently moved. She is compliant to her losartan 50 mg and HCTZ 12.5 mg. She is feeling much better since her last visit, no headaches. She has noticed dizziness sometimes at night when laying down. She continues to drink little water, drinking mostly coffee.    BP Readings from Last 3 Encounters:  03/28/21 140/86  03/07/21 (!) 150/88  09/02/20 (!) 144/86   2) Chronic Rash: Intermittent to the skin, starts as "a pimple" then will break out into hives. Chronic since her teenage years, recently occurring to her right forearm over the last few months. She denies itching.   Most recent reaction was 2-3 months just after moving. She thinks that the rash was secondary to dust from carrying boxes. She's never undergone testing.   She denies changes in food. She did start losartan, but her hives began prior to beginning her losartan.   3) Abdominal Pain: Chronic to bilateral lower abdomen for the last few years, feels as though something is "lose" to her bilateral lower abdomen. She will lay onto her left side while laying in bed and feel everything "shift" over which causes pain. She's since purchased a waist belt at night to help compress her abdomen. History of hysterectomy due to uterine fibroids. She has six children.   Review of Systems  Eyes:  Negative for visual  disturbance.  Respiratory:  Negative for shortness of breath.   Cardiovascular:  Negative for chest pain.  Gastrointestinal:  Positive for abdominal pain. Negative for constipation and diarrhea.  Neurological:  Negative for headaches.        Past Medical History:  Diagnosis Date   Classic migraine with aura 09/05/2017   Frequent headaches    Heart murmur    Hyperlipidemia    Hypertension    Migraine     Social History   Socioeconomic History   Marital status: Married    Spouse name: Not on file   Number of children: Not on file   Years of education: Not on file   Highest education level: Not on file  Occupational History   Occupation: [employer]  Tobacco Use   Smoking status: Never   Smokeless tobacco: Never  Vaping Use   Vaping Use: Never used  Substance and Sexual Activity   Alcohol use: Yes    Comment: rare   Drug use: No   Sexual activity: Not on file  Other Topics Concern   Not on file  Social History Narrative   Lives w/ spouse   Caffeine use: coffee/tea/soda daily   Right handed    Married.   6 children. 1 grandchild.   Works at [employer].   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   Social Determinants of Health   Financial Resource Strain: Not on file  Food Insecurity: Not on file  Transportation Needs: Not on file  Physical Activity: Not on file  Stress: Not on file  Social Connections: Not on file  Intimate Partner Violence: Not on file    Past Surgical History:  Procedure Laterality Date   ABDOMINAL HYSTERECTOMY  2010    Family History  Problem Relation Age of Onset   Hyperlipidemia Mother    Heart disease Mother    Hypertension Mother    Heart disease Maternal Grandmother    Hyperlipidemia Maternal Grandmother    Hypertension Maternal Grandmother    Liver disease Father        ETOH   Lupus Sister    Intracerebral hemorrhage Sister     Allergies  Allergen Reactions   Penicillins Other (See Comments)    unknown    Current  Outpatient Medications on File Prior to Visit  Medication Sig Dispense Refill   Ascorbic Acid (VITAMIN C PO) Take by mouth.     atorvastatin (LIPITOR) 40 MG tablet Take 1 tablet (40 mg total) by mouth daily. For cholesterol. 90 tablet 3   Continuous Blood Gluc Receiver (FREESTYLE LIBRE 2 READER) DEVI Use to check blood sugars. 1 each 0   Continuous Blood Gluc Sensor (FREESTYLE LIBRE 2 SENSOR) MISC Apply every 14 days to check blood sugars. 3 each 1   Cyanocobalamin (VITAMIN B-12 PO) Take by mouth.     famotidine (PEPCID) 20 MG tablet TAKE 1 TABLET BY MOUTH  TWICE DAILY FOR HEARTBURN 180 tablet 1   losartan-hydrochlorothiazide (HYZAAR) 50-12.5 MG tablet Take 1 tablet by mouth daily. For blood pressure. 30 tablet 0   metFORMIN (GLUCOPHAGE-XR) 500 MG 24 hr tablet Take 1 tablet (500 mg total) by mouth daily with breakfast. For diabetes. 90 tablet 1   Multiple Vitamin (MULTIVITAMIN ADULT PO) Take by mouth.     Niacin (VITAMIN B-3 PO)      VITAMIN D-VITAMIN K PO Take by mouth.     No current facility-administered medications on file prior to visit.    BP 140/86   Pulse 93   Temp 98 F (36.7 C) (Temporal)   Ht 5\' 3"  (1.6 m)   Wt 185 lb 7 oz (84.1 kg)   SpO2 98%   BMI 32.85 kg/m  Objective:   Physical Exam Cardiovascular:     Rate and Rhythm: Normal rate and regular rhythm.  Pulmonary:     Effort: Pulmonary effort is normal.     Breath sounds: Normal breath sounds.  Abdominal:     Palpations: Abdomen is soft.     Tenderness: There is no abdominal tenderness.     Hernia: No hernia is present.  Musculoskeletal:     Cervical back: Neck supple.  Skin:    General: Skin is warm and dry.          Assessment & Plan:      This visit occurred during the SARS-CoV-2 public health emergency.  Safety protocols were in place, including screening questions prior to the visit, additional usage of staff PPE, and extensive cleaning of exam room while observing appropriate contact time as  indicated for disinfecting solutions.

## 2021-03-28 NOTE — Assessment & Plan Note (Signed)
Chronic and intermittent for years.  Unclear etiology. There is evidence of hypopigmentation from rash 2-3 months ago.  Offered allergy testing, she accepts. Await results.

## 2021-03-28 NOTE — Patient Instructions (Signed)
Start monitoring your blood pressure daily, around the same time of day, for the next 2-3 weeks.  Ensure that you have rested for 30 minutes prior to checking your blood pressure. Record your readings and notify me if you consistently run at or above 135 on top and/or 90 on bottom.  Stop by the lab prior to leaving today. I will notify you of your results once received.   You will be contacted regarding your ultrasound.  Please let us know if you have not been contacted within two weeks.   It was a pleasure to see you today!

## 2021-03-29 ENCOUNTER — Telehealth: Payer: Self-pay | Admitting: Primary Care

## 2021-03-29 DIAGNOSIS — G8929 Other chronic pain: Secondary | ICD-10-CM

## 2021-03-29 LAB — FOOD ALLERGY PROFILE
Allergen, Salmon, f41: <0.1 kU/L
Almonds: <0.1 kU/L
CLASS: 0
CLASS: 0
CLASS: 0
CLASS: 0
CLASS: 0
CLASS: 0
CLASS: 0
CLASS: 0
CLASS: 0
CLASS: 0
CLASS: 0
Cashew IgE: <0.1 kU/L
Class: 0
Class: 0
Class: 0
Class: 0
Egg White IgE: <0.1 kU/L
Fish Cod: <0.1 kU/L
Hazelnut: <0.1 kU/L
Milk IgE: <0.1 kU/L
Peanut IgE: <0.1 kU/L
Scallop IgE: <0.1 kU/L
Sesame Seed f10: <0.1 kU/L
Shrimp IgE: <0.1 kU/L
Soybean IgE: <0.1 kU/L
Tuna IgE: <0.1 kU/L
Walnut: <0.1 kU/L
Wheat IgE: <0.1 kU/L

## 2021-03-29 LAB — ALLERGY PANEL 11, MOLD GROUP
Allergen, A. alternata, m6: <0.1 kU/L
Allergen, Mucor Racemosus, M4: <0.1 kU/L
Aspergillus fumigatus, m3: <0.1 kU/L
CLADOSPORIUM HERBARUM (M2) IGE: <0.1 kU/L
CLASS: 0
CLASS: 0
Candida Albicans: <0.1 kU/L
Class: 0
Class: 0
Class: 0

## 2021-03-29 LAB — ALLERGY PANEL, REGION 2, GRASSES
CLASS: 0
CLASS: 0
CLASS: 0
Class: 0
Class: 0
G005 Rye, Perennial: <0.1 kU/L
G009 Red Top: <0.1 kU/L
Johnson Grass: <0.1 kU/L
ORCHARD GRASS (COCKSFOOT) (G3) IGE: <0.1 kU/L
Timothy Grass: <0.1 kU/L

## 2021-03-29 LAB — INTERPRETATION:

## 2021-03-29 NOTE — Telephone Encounter (Signed)
error 

## 2021-03-29 NOTE — Telephone Encounter (Signed)
Stacey Murray with [facility] called stating that Pain is not an acceptable Diagnose. Stacey Murray states that they need a more specific diagnose.

## 2021-03-29 NOTE — Telephone Encounter (Signed)
The diagnosis is "chronic abdominal pain" how is that not acceptable? Can she provide an example of what may be covered?

## 2021-03-30 NOTE — Telephone Encounter (Signed)
Order changed to abdominal ultrasound without limited.

## 2021-03-30 NOTE — Addendum Note (Signed)
Addended by: Pleas Koch on: 03/30/2021 04:41 PM   Modules accepted: Orders

## 2021-03-30 NOTE — Telephone Encounter (Signed)
Called [facility] it was not a diagnosis code that needs to be changed the order needs to be U/S Pelvic complete. If you do not want to have transvaginal u/s done needs to be documented  on order.

## 2021-04-03 NOTE — Telephone Encounter (Signed)
Stacey Murray with [facility] called asking  if they still want to look at lower quadrant pain ultra sound pelvic complete with or without transvaginal.Nicole states that you can call her at [phone] ext [phone].

## 2021-04-05 NOTE — Telephone Encounter (Addendum)
I attempted to call the phone number with extension number, no answer.  I would like an abdominal ultrasound that we will capture the lower abdomen and pelvis  Need to evaluate for hernia.  I do not believe that we need a transvaginal ultrasound.

## 2021-04-06 ENCOUNTER — Other Ambulatory Visit: Payer: Self-pay | Admitting: Primary Care

## 2021-04-06 DIAGNOSIS — I10 Essential (primary) hypertension: Secondary | ICD-10-CM

## 2021-04-06 NOTE — Addendum Note (Signed)
Addended by: Pleas Koch on: 04/06/2021 05:47 PM   Modules accepted: Orders

## 2021-04-06 NOTE — Telephone Encounter (Signed)
Noted, order placed. 

## 2021-04-06 NOTE — Telephone Encounter (Signed)
Called back states that it should be Pelvic limited can you change order again

## 2021-04-10 ENCOUNTER — Encounter: Payer: Self-pay | Admitting: *Deleted

## 2021-04-13 ENCOUNTER — Other Ambulatory Visit: Payer: Self-pay

## 2021-04-13 ENCOUNTER — Ambulatory Visit
Admission: RE | Admit: 2021-04-13 | Discharge: 2021-04-13 | Disposition: A | Discharge status: Home / Self Care | Payer: Managed Care, Other (non HMO) | Source: Ambulatory Visit | Attending: Primary Care | Admitting: Primary Care

## 2021-04-13 DIAGNOSIS — G8929 Other chronic pain: Secondary | ICD-10-CM

## 2021-04-14 ENCOUNTER — Other Ambulatory Visit: Payer: Self-pay | Admitting: Primary Care

## 2021-04-14 DIAGNOSIS — K219 Gastro-esophageal reflux disease without esophagitis: Secondary | ICD-10-CM

## 2021-04-16 ENCOUNTER — Encounter: Payer: Self-pay | Admitting: Primary Care

## 2021-05-14 ENCOUNTER — Other Ambulatory Visit: Payer: Self-pay | Admitting: Primary Care

## 2021-05-14 DIAGNOSIS — E119 Type 2 diabetes mellitus without complications: Secondary | ICD-10-CM

## 2021-05-14 DIAGNOSIS — I10 Essential (primary) hypertension: Secondary | ICD-10-CM

## 2021-06-06 ENCOUNTER — Ambulatory Visit: Payer: Managed Care, Other (non HMO) | Admitting: Primary Care

## 2021-06-07 ENCOUNTER — Ambulatory Visit: Payer: Managed Care, Other (non HMO) | Admitting: Primary Care

## 2021-06-13 ENCOUNTER — Encounter: Payer: Self-pay | Admitting: Internal Medicine

## 2021-06-13 ENCOUNTER — Ambulatory Visit: Payer: Managed Care, Other (non HMO) | Admitting: Primary Care

## 2021-06-13 ENCOUNTER — Other Ambulatory Visit: Payer: Self-pay

## 2021-06-13 ENCOUNTER — Encounter: Payer: Self-pay | Admitting: Primary Care

## 2021-06-13 VITALS — BP 136/84 | HR 94 | Temp 97.7°F | Ht 63.0 in | Wt 191.0 lb

## 2021-06-13 DIAGNOSIS — Z0001 Encounter for general adult medical examination with abnormal findings: Secondary | ICD-10-CM

## 2021-06-13 DIAGNOSIS — G8929 Other chronic pain: Secondary | ICD-10-CM

## 2021-06-13 DIAGNOSIS — I10 Essential (primary) hypertension: Secondary | ICD-10-CM | POA: Diagnosis not present

## 2021-06-13 DIAGNOSIS — E1165 Type 2 diabetes mellitus with hyperglycemia: Secondary | ICD-10-CM

## 2021-06-13 DIAGNOSIS — Z1211 Encounter for screening for malignant neoplasm of colon: Secondary | ICD-10-CM | POA: Diagnosis not present

## 2021-06-13 DIAGNOSIS — G43109 Migraine with aura, not intractable, without status migrainosus: Secondary | ICD-10-CM | POA: Diagnosis not present

## 2021-06-13 DIAGNOSIS — R519 Headache, unspecified: Secondary | ICD-10-CM

## 2021-06-13 DIAGNOSIS — R109 Unspecified abdominal pain: Secondary | ICD-10-CM

## 2021-06-13 DIAGNOSIS — K219 Gastro-esophageal reflux disease without esophagitis: Secondary | ICD-10-CM

## 2021-06-13 DIAGNOSIS — E782 Mixed hyperlipidemia: Secondary | ICD-10-CM

## 2021-06-13 DIAGNOSIS — Z23 Encounter for immunization: Secondary | ICD-10-CM | POA: Diagnosis not present

## 2021-06-13 MED ORDER — OMEPRAZOLE 20 MG PO CPDR: DELAYED_RELEASE_CAPSULE | ORAL | 0 refills | Status: AC

## 2021-06-13 NOTE — Assessment & Plan Note (Signed)
Compliant to atorvastatin 40 mg. Continue atorvastatin 40 mg.   Repeat lipid panel pending.

## 2021-06-13 NOTE — Assessment & Plan Note (Signed)
Repeat A1C pending. Continue metformin XR 500 mg daily.  Eye exam UTD, will obtain records.  Managed on statin and ARB. Pneumonia vaccine provided today.  We will see her back in 6 months.

## 2021-06-13 NOTE — Addendum Note (Signed)
Addended by: Francella Solian on: 06/13/2021 08:48 AM   Modules accepted: Orders

## 2021-06-13 NOTE — Assessment & Plan Note (Addendum)
Continued symptoms despite famotidine 40 mg. Discussed options including weight loss, PPI use.   Stop famotidine 40 mg, start omeprazole 20 mg 1 or 2 times daily.   She will update if no improvement.

## 2021-06-13 NOTE — Assessment & Plan Note (Signed)
Resolved.  No complaints today.

## 2021-06-13 NOTE — Assessment & Plan Note (Signed)
No recent migraines, continue to monitor.

## 2021-06-13 NOTE — Addendum Note (Signed)
Addended by: Ellamae Sia on: 06/13/2021 08:33 AM   Modules accepted: Orders

## 2021-06-13 NOTE — Progress Notes (Signed)
Subjective:    Patient ID: Stacey Murray, female    DOB: 09-Apr-1961, 61 y.o.   MRN: [id number]  HPI  Stacey Murray is a very pleasant 62 y.o. female who presents today for complete physical and follow up of chronic conditions.  She would also like to discuss ongoing heartburn. Symptoms include bloating, belching, esophageal burning. She's been taking famotidine 40 mg daily for a long time, still has daily symptoms. Overall symptoms have improved with famotidine, just no resolve.    Immunizations: -Tetanus: 2017 -Influenza: Completed this season  -Covid-19: 2 vaccines -Shingles: Shingrix completed -Pneumonia: Never completed, due today  Diet: Fair diet. Is working on her diet overall.  Exercise: No regular exercise.  Eye exam: Completes annually, UTD, will get records Dental exam: Completes semi-annually   Pap Smear: Hysterectomy  Mammogram: Completed in July 2022 Colonoscopy: Never completed   BP Readings from Last 3 Encounters:  06/13/21 136/84  03/28/21 140/86  03/07/21 (!) 150/88      Review of Systems  Constitutional:  Negative for unexpected weight change.  HENT:  Negative for rhinorrhea.   Eyes:  Negative for visual disturbance.  Respiratory:  Negative for shortness of breath.   Cardiovascular:  Negative for chest pain.  Gastrointestinal:  Negative for constipation and diarrhea.  Genitourinary:  Negative for difficulty urinating.  Musculoskeletal:  Negative for arthralgias and myalgias.  Skin:  Negative for rash.  Allergic/Immunologic: Negative for environmental allergies.  Neurological:  Negative for dizziness, numbness and headaches.  Psychiatric/Behavioral:  The patient is not nervous/anxious.         Past Medical History:  Diagnosis Date   Classic migraine with aura 09/05/2017   Frequent headaches    Heart murmur    Hyperlipidemia    Hypertension    Migraine     Social History   Socioeconomic History   Marital status: Married    Spouse name:  Not on file   Number of children: Not on file   Years of education: Not on file   Highest education level: Not on file  Occupational History   Occupation: [employer]  Tobacco Use   Smoking status: Never   Smokeless tobacco: Never  Vaping Use   Vaping Use: Never used  Substance and Sexual Activity   Alcohol use: Yes    Comment: rare   Drug use: No   Sexual activity: Not on file  Other Topics Concern   Not on file  Social History Narrative   Lives w/ spouse   Caffeine use: coffee/tea/soda daily   Right handed    Married.   6 children. 1 grandchild.   Works at [employer].   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   Social Determinants of Health   Financial Resource Strain: Not on file  Food Insecurity: Not on file  Transportation Needs: Not on file  Physical Activity: Not on file  Stress: Not on file  Social Connections: Not on file  Intimate Partner Violence: Not on file    Past Surgical History:  Procedure Laterality Date   ABDOMINAL HYSTERECTOMY  2010    Family History  Problem Relation Age of Onset   Hyperlipidemia Mother    Heart disease Mother    Hypertension Mother    Heart disease Maternal Grandmother    Hyperlipidemia Maternal Grandmother    Hypertension Maternal Grandmother    Liver disease Father        ETOH   Lupus Sister    Intracerebral hemorrhage Sister     Allergies  Allergen Reactions   Penicillins Other (See Comments)    unknown    Current Outpatient Medications on File Prior to Visit  Medication Sig Dispense Refill   Ascorbic Acid (VITAMIN C PO) Take by mouth.     atorvastatin (LIPITOR) 40 MG tablet Take 1 tablet (40 mg total) by mouth daily. For cholesterol. 90 tablet 3   Continuous Blood Gluc Receiver (FREESTYLE LIBRE 2 READER) DEVI Use to check blood sugars. 1 each 0   Continuous Blood Gluc Sensor (FREESTYLE LIBRE 2 SENSOR) MISC Apply every 14 days to check blood sugars. 3 each 1   Cyanocobalamin (VITAMIN B-12 PO) Take by mouth.      famotidine (PEPCID) 20 MG tablet TAKE 1 TABLET BY MOUTH  TWICE DAILY FOR HEARTBURN 180 tablet 1   losartan-hydrochlorothiazide (HYZAAR) 50-12.5 MG tablet TAKE 1 TABLET BY MOUTH DAILY FOR BLOOD PRESSURE 90 tablet 0   metFORMIN (GLUCOPHAGE-XR) 500 MG 24 hr tablet TAKE 1 TABLET(500 MG) BY MOUTH DAILY WITH BREAKFAST FOR DIABETES 90 tablet 0   Multiple Vitamin (MULTIVITAMIN ADULT PO) Take by mouth.     Niacin (VITAMIN B-3 PO)      VITAMIN D-VITAMIN K PO Take by mouth.     No current facility-administered medications on file prior to visit.    BP 136/84    Pulse 94    Temp 97.7 F (36.5 C) (Temporal)    Ht 5\' 3"  (1.6 m)    Wt 191 lb (86.6 kg)    SpO2 97%    BMI 33.83 kg/m  Objective:   Physical Exam HENT:     Right Ear: Tympanic membrane and ear canal normal.     Left Ear: Tympanic membrane and ear canal normal.     Nose: Nose normal.  Eyes:     Conjunctiva/sclera: Conjunctivae normal.     Pupils: Pupils are equal, round, and reactive to light.  Neck:     Thyroid: No thyromegaly.  Cardiovascular:     Rate and Rhythm: Normal rate and regular rhythm.     Heart sounds: No murmur heard. Pulmonary:     Effort: Pulmonary effort is normal.     Breath sounds: Normal breath sounds. No rales.  Abdominal:     General: Bowel sounds are normal.     Palpations: Abdomen is soft.     Tenderness: There is no abdominal tenderness.  Musculoskeletal:        General: Normal range of motion.     Cervical back: Neck supple.  Lymphadenopathy:     Cervical: No cervical adenopathy.  Skin:    General: Skin is warm and dry.     Findings: No rash.  Neurological:     Mental Status: She is alert and oriented to person, place, and time.     Cranial Nerves: No cranial nerve deficit.     Deep Tendon Reflexes: Reflexes are normal and symmetric.  Psychiatric:        Mood and Affect: Mood normal.          Assessment & Plan:      This visit occurred during the SARS-CoV-2 public health emergency.   Safety protocols were in place, including screening questions prior to the visit, additional usage of staff PPE, and extensive cleaning of exam room while observing appropriate contact time as indicated for disinfecting solutions.

## 2021-06-13 NOTE — Assessment & Plan Note (Signed)
Pneumovax 23 provided today, other vaccines UTD. Mammogram UTD. Colonoscopy overdue, referral placed to gi.  Discussed the importance of a healthy diet and regular exercise in order for weight loss, and to reduce the risk of further co-morbidity.  Exam today as noted. Labs pending.

## 2021-06-13 NOTE — Assessment & Plan Note (Signed)
Stable, slightly improved from last visit.  She is working on lifestyle changes. Continue losartan-HCTZ 50-12.5 mg daily.  CMP pending.

## 2021-06-13 NOTE — Patient Instructions (Signed)
Stop by the lab prior to leaving today. I will notify you of your results once received.   Stop taking famotidine 20 mg for heartburn.  Start taking omeprazole 20 mg for heartburn. Take 1 capsule by mouth once or twice daily. Please update me!  You will be contacted regarding your referral to GI for the colonoscopy.  Please let us know if you have not been contacted within two weeks.   It was a pleasure to see you today!  Preventive Care 72-63 Years Old, Female Preventive care refers to lifestyle choices and visits with your health care provider that can promote health and wellness. Preventive care visits are also called wellness exams. What can I expect for my preventive care visit? Counseling Your health care provider may ask you questions about your: Medical history, including: Past medical problems. Family medical history. Pregnancy history. Current health, including: Menstrual cycle. Method of birth control. Emotional well-being. Home life and relationship well-being. Sexual activity and sexual health. Lifestyle, including: Alcohol, nicotine or tobacco, and drug use. Access to firearms. Diet, exercise, and sleep habits. Work and work Statistician. Sunscreen use. Safety issues such as seatbelt and bike helmet use. Physical exam Your health care provider will check your: Height and weight. These may be used to calculate your BMI (body mass index). BMI is a measurement that tells if you are at a healthy weight. Waist circumference. This measures the distance around your waistline. This measurement also tells if you are at a healthy weight and may help predict your risk of certain diseases, such as type 2 diabetes and high blood pressure. Heart rate and blood pressure. Body temperature. Skin for abnormal spots. What immunizations do I need? Vaccines are usually given at various ages, according to a schedule. Your health care provider will recommend vaccines for you based on  your age, medical history, and lifestyle or other factors, such as travel or where you work. What tests do I need? Screening Your health care provider may recommend screening tests for certain conditions. This may include: Lipid and cholesterol levels. Diabetes screening. This is done by checking your blood sugar (glucose) after you have not eaten for a while (fasting). Pelvic exam and Pap test. Hepatitis B test. Hepatitis C test. HIV (human immunodeficiency virus) test. STI (sexually transmitted infection) testing, if you are at risk. Lung cancer screening. Colorectal cancer screening. Mammogram. Talk with your health care provider about when you should start having regular mammograms. This may depend on whether you have a family history of breast cancer. BRCA-related cancer screening. This may be done if you have a family history of breast, ovarian, tubal, or peritoneal cancers. Bone density scan. This is done to screen for osteoporosis. Talk with your health care provider about your test results, treatment options, and if necessary, the need for more tests. Follow these instructions at home: Eating and drinking  Eat a diet that includes fresh fruits and vegetables, whole grains, lean protein, and low-fat dairy products. Take vitamin and mineral supplements as recommended by your health care provider. Do not drink alcohol if: Your health care provider tells you not to drink. You are pregnant, may be pregnant, or are planning to become pregnant. If you drink alcohol: Limit how much you have to 0-1 drink a day. Know how much alcohol is in your drink. In the [place] one drink equals one 12 oz bottle of beer (355 mL), one 5 oz glass of wine (148 mL), or one 1 oz glass of hard liquor (  44 mL). Lifestyle Brush your teeth every morning and night with fluoride toothpaste. Floss one time each day. Exercise for at least 30 minutes 5 or more days each week. Do not use any products that contain  nicotine or tobacco. These products include cigarettes, chewing tobacco, and vaping devices, such as e-cigarettes. If you need help quitting, ask your health care provider. Do not use drugs. If you are sexually active, practice safe sex. Use a condom or other form of protection to prevent STIs. If you do not wish to become pregnant, use a form of birth control. If you plan to become pregnant, see your health care provider for a prepregnancy visit. Take aspirin only as told by your health care provider. Make sure that you understand how much to take and what form to take. Work with your health care provider to find out whether it is safe and beneficial for you to take aspirin daily. Find healthy ways to manage stress, such as: Meditation, yoga, or listening to music. Journaling. Talking to a trusted person. Spending time with friends and family. Minimize exposure to UV radiation to reduce your risk of skin cancer. Safety Always wear your seat belt while driving or riding in a vehicle. Do not drive: If you have been drinking alcohol. Do not ride with someone who has been drinking. When you are tired or distracted. While texting. If you have been using any mind-altering substances or drugs. Wear a helmet and other protective equipment during sports activities. If you have firearms in your house, make sure you follow all gun safety procedures. Seek help if you have been physically or sexually abused. What's next? Visit your health care provider once a year for an annual wellness visit. Ask your health care provider how often you should have your eyes and teeth checked. Stay up to date on all vaccines. This information is not intended to replace advice given to you by your health care provider. Make sure you discuss any questions you have with your health care provider. Document Revised: 11/09/2020 Document Reviewed: 11/09/2020 Elsevier Patient Education  [employer].

## 2021-06-13 NOTE — Assessment & Plan Note (Signed)
Denies concerns today, continue to monitor.  

## 2021-06-14 LAB — COMPREHENSIVE METABOLIC PANEL
ALT: 48 IU/L — ABNORMAL HIGH (ref 0–32)
AST: 26 IU/L (ref 0–40)
Albumin/Globulin Ratio: 1.4 (ref 1.2–2.2)
Albumin: 4.6 g/dL (ref 3.8–4.8)
Alkaline Phosphatase: 70 IU/L (ref 44–121)
BUN/Creatinine Ratio: 22 (ref 12–28)
BUN: 18 mg/dL (ref 8–27)
Bilirubin Total: 0.4 mg/dL (ref 0.0–1.2)
CO2: 21 mmol/L (ref 20–29)
Calcium: 9.6 mg/dL (ref 8.7–10.3)
Chloride: 103 mmol/L (ref 96–106)
Creatinine, Ser: 0.82 mg/dL (ref 0.57–1.00)
Globulin, Total: 3.2 g/dL (ref 1.5–4.5)
Glucose: 140 mg/dL — ABNORMAL HIGH (ref 70–99)
Potassium: 4.1 mmol/L (ref 3.5–5.2)
Sodium: 141 mmol/L (ref 134–144)
Total Protein: 7.8 g/dL (ref 6.0–8.5)
eGFR: 81 mL/min/{1.73_m2} (ref >59)

## 2021-06-14 LAB — LIPID PANEL
Chol/HDL Ratio: 2.9 ratio (ref 0.0–4.4)
Cholesterol, Total: 171 mg/dL (ref 100–199)
HDL: 60 mg/dL (ref >39)
LDL Chol Calc (NIH): 93 mg/dL (ref 0–99)
Triglycerides: 99 mg/dL (ref 0–149)
VLDL Cholesterol Cal: 18 mg/dL (ref 5–40)

## 2021-06-14 LAB — HEMOGLOBIN A1C
Est. average glucose Bld gHb Est-mCnc: 140 mg/dL
Hgb A1c MFr Bld: 6.5 % — ABNORMAL HIGH (ref 4.8–5.6)

## 2021-06-14 LAB — CBC
Hematocrit: 45.7 % (ref 34.0–46.6)
Hemoglobin: 15 g/dL (ref 11.1–15.9)
MCH: 29 pg (ref 26.6–33.0)
MCHC: 32.8 g/dL (ref 31.5–35.7)
MCV: 88 fL (ref 79–97)
Platelets: 278 10*3/uL (ref 150–450)
RBC: 5.18 x10E6/uL (ref 3.77–5.28)
RDW: 13.3 % (ref 11.7–15.4)
WBC: 6.6 10*3/uL (ref 3.4–10.8)

## 2021-06-16 ENCOUNTER — Telehealth: Payer: Self-pay

## 2021-06-16 NOTE — Telephone Encounter (Signed)
PA submitted for Omeprazole 20 mg capsules BID via covermymeds Waiting on decision  Key: [id number] - PA Case ID: [id number] - Rx #: [id number]

## 2021-07-03 ENCOUNTER — Other Ambulatory Visit: Payer: Self-pay

## 2021-07-03 ENCOUNTER — Ambulatory Visit (AMBULATORY_SURGERY_CENTER): Payer: Managed Care, Other (non HMO)

## 2021-07-03 VITALS — Ht 63.0 in | Wt 190.0 lb

## 2021-07-03 DIAGNOSIS — Z1211 Encounter for screening for malignant neoplasm of colon: Secondary | ICD-10-CM

## 2021-07-03 MED ORDER — NA SULFATE-K SULFATE-MG SULF 17.5-3.13-1.6 GM/177ML PO SOLN: 1.0000 | Freq: Once | ORAL | 0 refills | Status: AC

## 2021-07-03 NOTE — Progress Notes (Signed)
No egg or soy allergy known to patient  No issues known to pt with past sedation with any surgeries or procedures Patient denies ever being told they had issues or difficulty with intubation  No FH of Malignant Hyperthermia Pt is not on diet pills Pt is not on home 02  Pt is not on blood thinners  Pt denies issues with constipation  No A fib or A flutter Pt is fully vaccinated for Covid x 2; NO PA's for preps discussed with pt in PV today  Discussed with pt there will be an out-of-pocket cost for prep and that varies from $0 to 70 + dollars - pt verbalized understanding  Due to the COVID-19 pandemic we are asking patients to follow certain guidelines in PV and the [facility]   Pt aware of COVID protocols and [facility] guidelines  PV completed over the phone. Pt verified name, DOB, address and insurance during PV today.  Pt mailed instruction packet with copy of consent form to read and not return, and instructions.  Pt encouraged to call with questions or issues.  If pt has My chart, procedure instructions sent via My Chart

## 2021-07-07 ENCOUNTER — Other Ambulatory Visit: Payer: Self-pay | Admitting: Primary Care

## 2021-07-07 DIAGNOSIS — I10 Essential (primary) hypertension: Secondary | ICD-10-CM

## 2021-07-12 ENCOUNTER — Encounter: Payer: Self-pay | Admitting: Internal Medicine

## 2021-07-14 ENCOUNTER — Encounter: Payer: Self-pay | Admitting: Internal Medicine

## 2021-07-14 ENCOUNTER — Ambulatory Visit (AMBULATORY_SURGERY_CENTER): Payer: Managed Care, Other (non HMO) | Admitting: Internal Medicine

## 2021-07-14 VITALS — BP 122/74 | HR 80 | Temp 97.5°F | Resp 12 | Ht 63.0 in | Wt 190.0 lb

## 2021-07-14 DIAGNOSIS — D124 Benign neoplasm of descending colon: Secondary | ICD-10-CM

## 2021-07-14 DIAGNOSIS — Z1211 Encounter for screening for malignant neoplasm of colon: Secondary | ICD-10-CM | POA: Diagnosis not present

## 2021-07-14 DIAGNOSIS — D125 Benign neoplasm of sigmoid colon: Secondary | ICD-10-CM | POA: Diagnosis not present

## 2021-07-14 MED ORDER — SODIUM CHLORIDE 0.9 % IV SOLN: 500.0000 mL | INTRAVENOUS | Status: DC

## 2021-07-14 NOTE — Progress Notes (Signed)
To Pacu, VSS. Report to [email or web address] 

## 2021-07-14 NOTE — Patient Instructions (Signed)
Handout on polyps, diverticulosis and hemorrhoids given. ? ?YOU HAD AN ENDOSCOPIC PROCEDURE TODAY AT THE [facility]:   Refer to the procedure report that was given to you for any specific questions about what was found during the examination.  If the procedure report does not answer your questions, please call your gastroenterologist to clarify.  If you requested that your care partner not be given the details of your procedure findings, then the procedure report has been included in a sealed envelope for you to review at your convenience later. ? ?YOU SHOULD EXPECT: Some feelings of bloating in the abdomen. Passage of more gas than usual.  Walking can help get rid of the air that was put into your GI tract during the procedure and reduce the bloating. If you had a lower endoscopy (such as a colonoscopy or flexible sigmoidoscopy) you may notice spotting of blood in your stool or on the toilet paper. If you underwent a bowel prep for your procedure, you may not have a normal bowel movement for a few days. ? ?Please Note:  You might notice some irritation and congestion in your nose or some drainage.  This is from the oxygen used during your procedure.  There is no need for concern and it should clear up in a day or so. ? ?SYMPTOMS TO REPORT IMMEDIATELY: ? ?Following lower endoscopy (colonoscopy or flexible sigmoidoscopy): ? Excessive amounts of blood in the stool ? Significant tenderness or worsening of abdominal pains ? Swelling of the abdomen that is new, acute ? Fever of 100?F or higher ? ? ?For urgent or emergent issues, a gastroenterologist can be reached at any hour by calling [phone]. ?Do not use MyChart messaging for urgent concerns.  ? ? ?DIET:  We do recommend a small meal at first, but then you may proceed to your regular diet.  Drink plenty of fluids but you should avoid alcoholic beverages for 24 hours. ? ?ACTIVITY:  You should plan to take it easy for the rest of today and you  should NOT DRIVE or use heavy machinery until tomorrow (because of the sedation medicines used during the test).   ? ?FOLLOW UP: ?Our staff will call the number listed on your records 48-72 hours following your procedure to check on you and address any questions or concerns that you may have regarding the information given to you following your procedure. If we do not reach you, we will leave a message.  We will attempt to reach you two times.  During this call, we will ask if you have developed any symptoms of COVID 19. If you develop any symptoms (ie: fever, flu-like symptoms, shortness of breath, cough etc.) before then, please call [phone].  If you test positive for Covid 19 in the 2 weeks post procedure, please call and report this information to us.   ? ?If any biopsies were taken you will be contacted by phone or by letter within the next 1-3 weeks.  Please call us at [phone] if you have not heard about the biopsies in 3 weeks.  ? ? ?SIGNATURES/CONFIDENTIALITY: ?You and/or your care partner have signed paperwork which will be entered into your electronic medical record.  These signatures attest to the fact that that the information above on your After Visit Summary has been reviewed and is understood.  Full responsibility of the confidentiality of this discharge information lies with you and/or your care-partner.  ?

## 2021-07-14 NOTE — Progress Notes (Signed)
GASTROENTEROLOGY PROCEDURE H&P NOTE   Primary Care Physician: Pleas Koch, NP    Reason for Procedure:   Screening for colon cancer  Plan:    colonoscopy  Patient is appropriate for endoscopic procedure(s) in the ambulatory ([facility]) setting.  The nature of the procedure, as well as the risks, benefits, and alternatives were carefully and thoroughly reviewed with the patient. Ample time for discussion and questions allowed. The patient understood, was satisfied, and agreed to proceed.     HPI: Stacey Murray is a 61 y.o. female who presents for colonoscopy.  Medical history as below.  Tolerated the prep.  No recent chest pain or shortness of breath.  No abdominal pain today.  Past Medical History:  Diagnosis Date   Blood transfusion without reported diagnosis 2010   Classic migraine with aura 09/05/2017   Diabetes mellitus without complication (HCC)    on meds   Frequent headaches    GERD (gastroesophageal reflux disease)    on meds   Heart murmur    Hyperlipidemia    on meds   Hypertension    on meds   Migraine     Past Surgical History:  Procedure Laterality Date   ABDOMINAL HYSTERECTOMY  2010   BREAST BIOPSY Left     Prior to Admission medications   Medication Sig Start Date End Date Taking? Authorizing Provider  Ascorbic Acid (VITAMIN C PO) Take 1 tablet by mouth daily at 6 (six) AM.   Yes [provider]  atorvastatin (LIPITOR) 40 MG tablet Take 1 tablet (40 mg total) by mouth daily. For cholesterol. 09/06/20  Yes Pleas Koch, NP  Cyanocobalamin (VITAMIN B-12 PO) Take 1 tablet by mouth daily at 6 (six) AM.   Yes [provider]  losartan-hydrochlorothiazide (HYZAAR) 50-12.5 MG tablet TAKE 1 TABLET BY MOUTH DAILY FOR BLOOD PRESSURE 07/09/21  Yes Pleas Koch, NP  metFORMIN (GLUCOPHAGE-XR) 500 MG 24 hr tablet TAKE 1 TABLET(500 MG) BY MOUTH DAILY WITH BREAKFAST FOR DIABETES 05/14/21  Yes Pleas Koch, NP  Multiple Vitamin  (MULTIVITAMIN ADULT PO) Take 1 tablet by mouth daily at 6 (six) AM.   Yes [provider]  omeprazole (PRILOSEC) 20 MG capsule Take 1 capsule by mouth once to twice daily for heartburn. Patient taking differently: Take 20 mg by mouth daily. Take 1 capsule by mouth once to twice daily for heartburn. 06/13/21  Yes Pleas Koch, NP  VITAMIN D-VITAMIN K PO Take 1 tablet by mouth daily at 6 (six) AM.   Yes [provider]  Continuous Blood Gluc Receiver (FREESTYLE LIBRE 2 READER) DEVI Use to check blood sugars. 03/07/21   Pleas Koch, NP  Continuous Blood Gluc Sensor (FREESTYLE LIBRE 2 SENSOR) MISC Apply every 14 days to check blood sugars. 03/07/21   Pleas Koch, NP    Current Outpatient Medications  Medication Sig Dispense Refill   Ascorbic Acid (VITAMIN C PO) Take 1 tablet by mouth daily at 6 (six) AM.     atorvastatin (LIPITOR) 40 MG tablet Take 1 tablet (40 mg total) by mouth daily. For cholesterol. 90 tablet 3   Cyanocobalamin (VITAMIN B-12 PO) Take 1 tablet by mouth daily at 6 (six) AM.     losartan-hydrochlorothiazide (HYZAAR) 50-12.5 MG tablet TAKE 1 TABLET BY MOUTH DAILY FOR BLOOD PRESSURE 90 tablet 3   metFORMIN (GLUCOPHAGE-XR) 500 MG 24 hr tablet TAKE 1 TABLET(500 MG) BY MOUTH DAILY WITH BREAKFAST FOR DIABETES 90 tablet 0   Multiple  Vitamin (MULTIVITAMIN ADULT PO) Take 1 tablet by mouth daily at 6 (six) AM.     omeprazole (PRILOSEC) 20 MG capsule Take 1 capsule by mouth once to twice daily for heartburn. (Patient taking differently: Take 20 mg by mouth daily. Take 1 capsule by mouth once to twice daily for heartburn.) 180 capsule 0   VITAMIN D-VITAMIN K PO Take 1 tablet by mouth daily at 6 (six) AM.     Continuous Blood Gluc Receiver (FREESTYLE LIBRE 2 READER) DEVI Use to check blood sugars. 1 each 0   Continuous Blood Gluc Sensor (FREESTYLE LIBRE 2 SENSOR) MISC Apply every 14 days to check blood sugars. 3 each 1   Current Facility-Administered  Medications  Medication Dose Route Frequency Provider Last Rate Last Admin   0.9 %  sodium chloride infusion  500 mL Intravenous Continuous Merion Caton, Lajuan Lines, MD        Allergies as of 07/14/2021 - Review Complete 07/14/2021  Allergen Reaction Noted   Penicillins Other (See Comments) 03/05/2016    Family History  Problem Relation Age of Onset   Hyperlipidemia Mother    Heart disease Mother    Hypertension Mother    Liver disease Father        ETOH   Lupus Sister    Intracerebral hemorrhage Sister    Heart disease Maternal Grandmother    Hyperlipidemia Maternal Grandmother    Hypertension Maternal Grandmother    Colon cancer Neg Hx    Colon polyps Neg Hx     Social History   Socioeconomic History   Marital status: Married    Spouse name: Not on file   Number of children: Not on file   Years of education: Not on file   Highest education level: Not on file  Occupational History   Occupation: [employer]  Tobacco Use   Smoking status: Never    Passive exposure: Past   Smokeless tobacco: Never  Vaping Use   Vaping Use: Never used  Substance and Sexual Activity   Alcohol use: Yes    Comment: rare   Drug use: No   Sexual activity: Not on file  Other Topics Concern   Not on file  Social History Narrative   Lives w/ spouse   Caffeine use: coffee/tea/soda daily   Right handed    Married.   6 children. 1 grandchild.   Works at [employer].   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   Social Determinants of Health   Financial Resource Strain: Not on file  Food Insecurity: Not on file  Transportation Needs: Not on file  Physical Activity: Not on file  Stress: Not on file  Social Connections: Not on file  Intimate Partner Violence: Not on file    Physical Exam: Vital signs in last 24 hours: @BP  111/66    Pulse 95    Temp (!) 97.5 F (36.4 C) (Temporal)    Resp (!) 24    Ht 5\' 3"  (1.6 m)    Wt 190 lb (86.2 kg)    SpO2 99%    BMI 33.66 kg/m  GEN: NAD EYE: Sclerae  anicteric ENT: MMM CV: Non-tachycardic Pulm: CTA b/l GI: Soft, NT/ND NEURO:  Alert & Oriented x 3   Stacey Jarred, MD [facility]  07/14/2021 3:00 PM

## 2021-07-14 NOTE — Op Note (Signed)
[facility] Patient Name: Stacey Murray Procedure Date: 07/14/2021 2:25 PM MRN: [id number] Endoscopist: Jerene Bears , MD Age: 61 Referring MD:  Date of Birth: 1961-01-24 Gender: Female Account #: [id number] Procedure:                Colonoscopy Indications:              Screening for colorectal malignant neoplasm, This                            is the patient's first colonoscopy Medicines:                Monitored Anesthesia Care Procedure:                Pre-Anesthesia Assessment:                           - Prior to the procedure, a History and Physical                            was performed, and patient medications and                            allergies were reviewed. The patient's tolerance of                            previous anesthesia was also reviewed. The risks                            and benefits of the procedure and the sedation                            options and risks were discussed with the patient.                            All questions were answered, and informed consent                            was obtained. Prior Anticoagulants: The patient has                            taken no previous anticoagulant or antiplatelet                            agents. ASA Grade Assessment: II - A patient with                            mild systemic disease. After reviewing the risks                            and benefits, the patient was deemed in                            satisfactory condition to undergo the procedure.  After obtaining informed consent, the colonoscope                            was passed under direct vision. Throughout the                            procedure, the patient's blood pressure, pulse, and                            oxygen saturations were monitored continuously. The                            Olympus PCF-H190DL ([id number]) Colonoscope was                            introduced through the anus  and advanced to the                            cecum, identified by appendiceal orifice and                            ileocecal valve. The colonoscopy was performed                            without difficulty. The patient tolerated the                            procedure well. The quality of the bowel                            preparation was excellent. The ileocecal valve,                            appendiceal orifice, and rectum were photographed. Scope In: 2:40:58 PM Scope Out: 2:56:58 PM Scope Withdrawal Time: 0 hours 11 minutes 5 seconds  Total Procedure Duration: 0 hours 16 minutes 0 seconds  Findings:                 The digital rectal exam was normal.                           A 6 mm polyp was found in the descending colon. The                            polyp was sessile. The polyp was removed with a                            cold snare. Resection and retrieval were complete.                           A 6 mm polyp was found in the sigmoid colon. The                            polyp was sessile. The  polyp was removed with a                            cold snare. Resection and retrieval were complete.                           Multiple small and large-mouthed diverticula were                            found in the hepatic flexure, ascending colon and                            cecum.                           Internal hemorrhoids were found during                            retroflexion. The hemorrhoids were small. Complications:            No immediate complications. Estimated Blood Loss:     Estimated blood loss was minimal. Impression:               - One 6 mm polyp in the descending colon, removed                            with a cold snare. Resected and retrieved.                           - One 6 mm polyp in the sigmoid colon, removed with                            a cold snare. Resected and retrieved.                           - Diverticulosis at the hepatic  flexure, in the                            ascending colon and in the cecum.                           - Internal hemorrhoids. Recommendation:           - Patient has a contact number available for                            emergencies. The signs and symptoms of potential                            delayed complications were discussed with the                            patient. Return to normal activities tomorrow.                            Written discharge instructions were  provided to the                            patient.                           - Resume previous diet.                           - Continue present medications.                           - Await pathology results.                           - Repeat colonoscopy is recommended for                            surveillance. The colonoscopy date will be                            determined after pathology results from today's                            exam become available for review. Jerene Bears, MD 07/14/2021 3:11:05 PM This report has been signed electronically.

## 2021-07-14 NOTE — Progress Notes (Signed)
Pt's states no medical or surgical changes since previsit or office visit. 

## 2021-07-14 NOTE — Progress Notes (Signed)
Called to room to assist during endoscopic procedure.  Patient ID and intended procedure confirmed with present staff. Received instructions for my participation in the procedure from the performing physician.  

## 2021-07-19 ENCOUNTER — Telehealth: Payer: Self-pay

## 2021-07-19 NOTE — Telephone Encounter (Signed)
°  Follow up Call-  Call back number 07/14/2021  Post procedure Call Back phone  # [phone]  Permission to leave phone message Yes  Some recent data might be hidden     Patient questions:  Do you have a fever, pain , or abdominal swelling? No. Pain Score  0 *  Have you tolerated food without any problems? Yes.    Have you been able to return to your normal activities? Yes.    Do you have any questions about your discharge instructions: Diet   No. Medications  No. Follow up visit  No.  Do you have questions or concerns about your Care? No.  Actions: * If pain score is 4 or above: No action needed, pain <4.

## 2021-07-21 IMAGING — MG MM DIGITAL DIAGNOSTIC UNILAT*L* W/ TOMO W/ CAD
8 series · 8 of 24 positions shown · non-contrast
Comparison: Recent screening mammogram dated 10/29/2020.

CLINICAL DATA: Patient returns today to evaluate a possible LEFT
breast mass identified on recent baseline screening mammogram.

EXAM:
DIGITAL DIAGNOSTIC UNILATERAL LEFT MAMMOGRAM WITH TOMOSYNTHESIS AND
CAD; ULTRASOUND LEFT BREAST LIMITED
TECHNIQUE: Left digital diagnostic mammography and breast tomosynthesis was
performed. The images were evaluated with computer-aided detection.;
Targeted ultrasound examination of the left breast was performed

[L MLO synth-2D (1 of 2)]
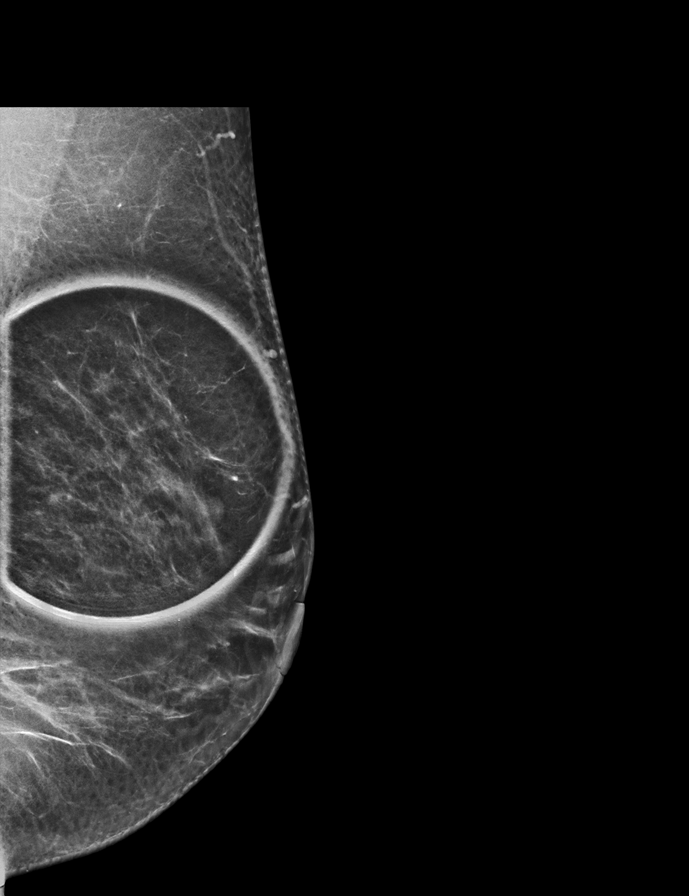

[L MLO synth-2D (2 of 2)]
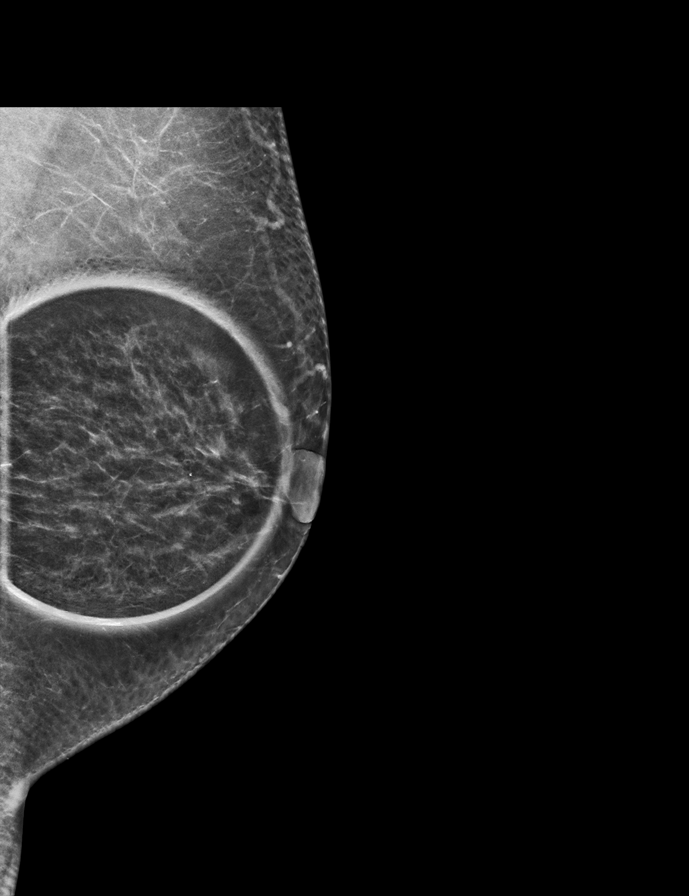

[L CC synth-2D]
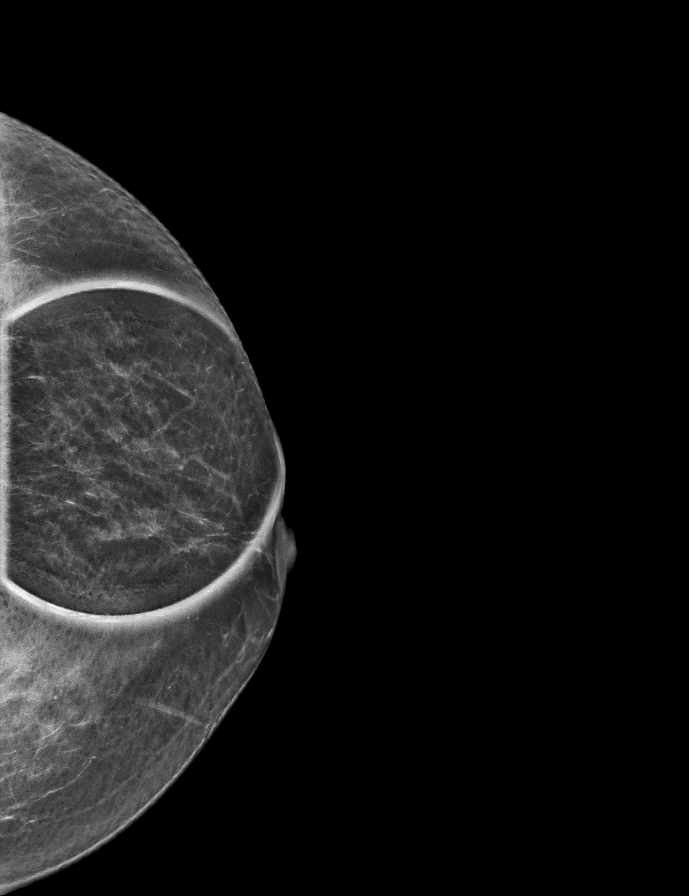

[L ML synth-2D]
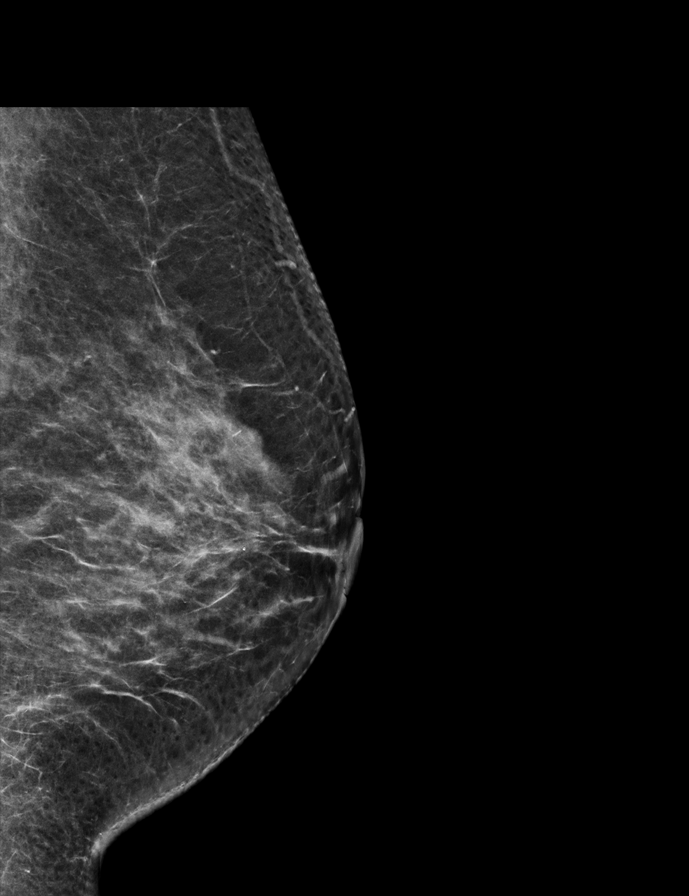

[L CC tomo · tomo slice 27/53.0]
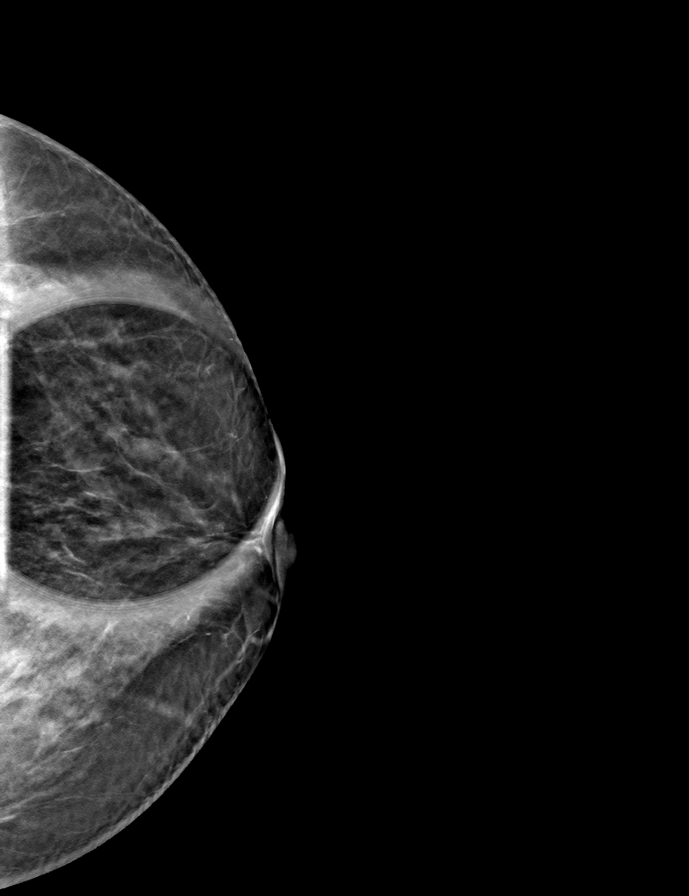

[L MLO tomo (1 of 2) · tomo slice 31/60.0]
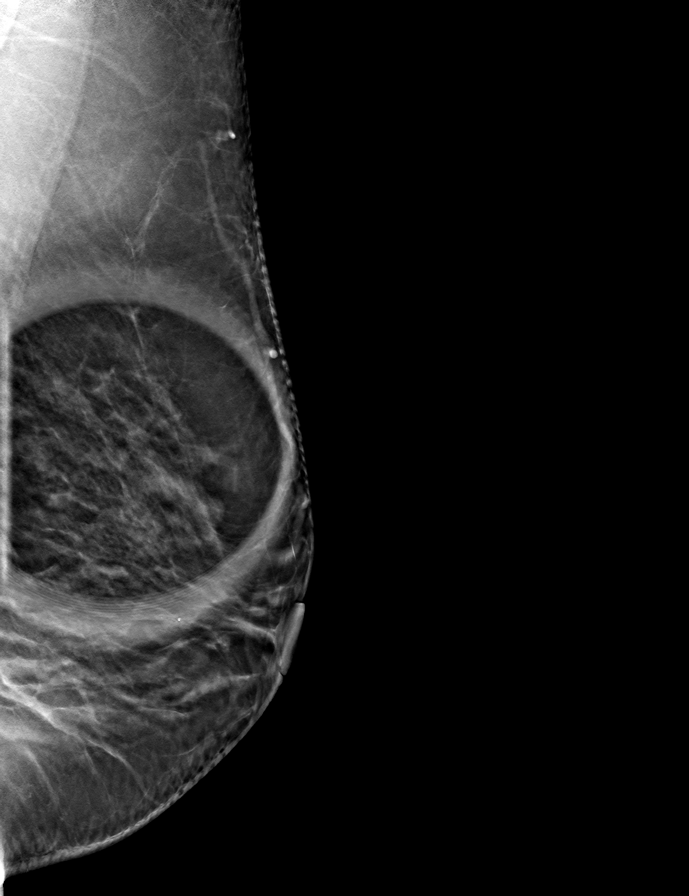

[L ML tomo · tomo slice 34/67.0]
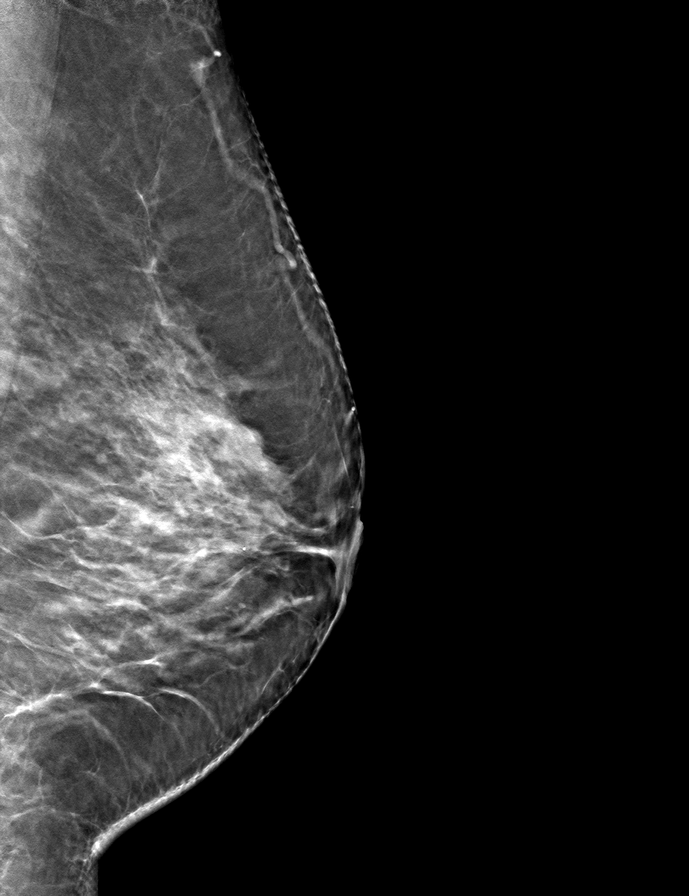

[L MLO tomo (2 of 2) · tomo slice 30/59.0]
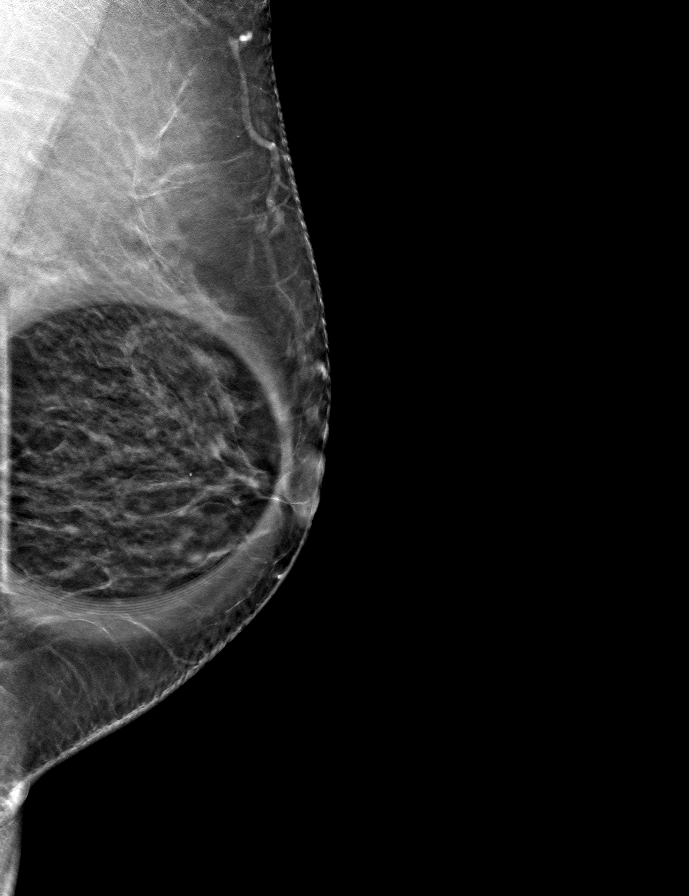

[8 of 24 positions shown; findings below may reference images not displayed]

ACR Breast Density Category c: The breast tissue is heterogeneously
dense, which may obscure small masses.
FINDINGS: LEFT breast diagnostic mammogram: On today's additional diagnostic
views, an oval circumscribed low-density mass is confirmed within
the outer LEFT breast, 2-3 o'clock axis region, measuring
approximately 8 mm greatest dimension.

Targeted ultrasound is performed, showing a complex cystic and solid
mass in the LEFT breast at the 2 o'clock axis, 2 cm from the nipple,
measuring 7 mm, without internal vascularity, corresponding to the
mammographic finding.

LEFT axilla was evaluated with ultrasound showing no enlarged or
morphologically abnormal lymph nodes.
IMPRESSION: Complex cystic and solid mass in the LEFT breast at the 2 o'clock
axis, 2 cm from the nipple, measuring 7 mm, without internal
vascularity, corresponding to the mammographic finding. This may
represent a complicated cyst with internal debris. Ultrasound-guided
biopsy is recommended.

RECOMMENDATION:
Ultrasound-guided biopsy for the LEFT breast mass at the 2 o'clock
axis.

Ultrasound-guided biopsy is scheduled for [REDACTED].

I have discussed the findings and recommendations with the patient.
If applicable, a reminder letter will be sent to the patient
regarding the next appointment.

BI-RADS CATEGORY  4: Suspicious.

## 2021-07-24 ENCOUNTER — Encounter: Payer: Self-pay | Admitting: Internal Medicine

## 2021-08-12 ENCOUNTER — Other Ambulatory Visit: Payer: Self-pay | Admitting: Primary Care

## 2021-08-12 DIAGNOSIS — E782 Mixed hyperlipidemia: Secondary | ICD-10-CM

## 2021-08-12 DIAGNOSIS — E119 Type 2 diabetes mellitus without complications: Secondary | ICD-10-CM

## 2021-09-28 ENCOUNTER — Other Ambulatory Visit: Payer: Self-pay | Admitting: Primary Care

## 2021-09-28 DIAGNOSIS — K219 Gastro-esophageal reflux disease without esophagitis: Secondary | ICD-10-CM

## 2021-12-12 ENCOUNTER — Ambulatory Visit: Payer: Managed Care, Other (non HMO) | Admitting: Primary Care
